# Patient Record
Sex: Male | Born: 1975
Health system: Southern US, Community
[De-identification: ages and names within clinical notes are randomized; demographics above are authoritative.]

## PROBLEM LIST (undated history)

## (undated) ENCOUNTER — Ambulatory Visit: Payer: Self-pay | Source: Home / Self Care

## (undated) DIAGNOSIS — S82899A Other fracture of unspecified lower leg, initial encounter for closed fracture: Secondary | ICD-10-CM

## (undated) DIAGNOSIS — K219 Gastro-esophageal reflux disease without esophagitis: Secondary | ICD-10-CM

---

## 2018-07-23 ENCOUNTER — Emergency Department (HOSPITAL_COMMUNITY)
Admission: EM | Admit: 2018-07-23 | Discharge: 2018-07-23 | Disposition: A | Discharge status: Home / Self Care | Payer: Self-pay | Attending: Emergency Medicine | Admitting: Emergency Medicine

## 2018-07-23 ENCOUNTER — Other Ambulatory Visit: Payer: Self-pay

## 2018-07-23 ENCOUNTER — Encounter (HOSPITAL_COMMUNITY): Payer: Self-pay | Admitting: Emergency Medicine

## 2018-07-23 DIAGNOSIS — F17200 Nicotine dependence, unspecified, uncomplicated: Secondary | ICD-10-CM | POA: Insufficient documentation

## 2018-07-23 DIAGNOSIS — L237 Allergic contact dermatitis due to plants, except food: Secondary | ICD-10-CM | POA: Insufficient documentation

## 2018-07-23 MED ORDER — PREDNISONE 10 MG PO TABS: ORAL_TABLET | ORAL | 0 refills | Status: DC

## 2018-07-23 NOTE — ED Notes (Signed)
Patient verbalizes understanding of discharge instructions. Opportunity for questioning and answers were provided. Armband removed by staff, pt discharged from ED.  

## 2018-07-23 NOTE — Discharge Instructions (Addendum)
Please read attached information. If you experience any new or worsening signs or symptoms please return to the emergency room for evaluation. Please follow-up with your primary care provider or specialist as discussed. Please use medication prescribed only as directed and discontinue taking if you have any concerning signs or symptoms.   °

## 2018-07-23 NOTE — ED Provider Notes (Signed)
[facility] EMERGENCY DEPARTMENT Provider Note   CSN: [id number] Arrival date & time: 07/23/18  1149    History   Chief Complaint Chief Complaint  Patient presents with  . Rash    HPI Juan Marshall is a 43 y.o. male.     HPI   43 year old male presents today with rash.  Patient notes that on July 3 he was out in the yard cleaning the yard.  He notes he was in contact with numerous different plants and bushes.  Patient notes that later on in the day he developed rash to his face forearms and lower legs.  Notes this is extremely itchy.  He believes that he was exposed to poison ivy.  He denies any new abnormal contacts other than the plants, no new medications, he has no significant past medical history this typically is not diabetic.   History reviewed. No pertinent past medical history.  There are no active problems to display for this patient.   History reviewed. No pertinent surgical history.      Home Medications    Prior to Admission medications   Medication Sig Start Date End Date Taking? Authorizing Provider  predniSONE (DELTASONE) 10 MG tablet Days 1-5: Take 4 tablets (40 mg) daily, one each in morning and afternoon, two in the evening Days 6-7: Take 3 tablets (30 mg) daily, morning, afternoon, and evening Days 8-9: Take 2 tablets (20 mg) daily, morning and evening Days 10-11: Take 1 tablet (10 mg) daily Days 12-15: Take 1/2 tablet (5 mg) daily 07/23/18   Okey Regal, PA-C    Family History History reviewed. No pertinent family history.  Social History Social History   Tobacco Use  . Smoking status: Current Every Day Smoker  . Smokeless tobacco: Never Used  Substance Use Topics  . Alcohol use: Yes  . Drug use: Not on file     Allergies   Patient has no known allergies.   Review of Systems Review of Systems  All other systems reviewed and are negative.   Physical Exam Updated Vital Signs BP (!) 130/94 (BP Location: Right Arm)    Pulse 99   Temp 98 F (36.7 C) (Oral)   Resp 18   Ht 6\' 1"  (1.854 m)   Wt 86.2 kg   SpO2 98%   BMI 25.07 kg/m   Physical Exam Vitals signs and nursing note reviewed.  Constitutional:      Appearance: He is well-developed.  HENT:     Head: Normocephalic and atraumatic.  Eyes:     General: No scleral icterus.       Right eye: No discharge.        Left eye: No discharge.     Conjunctiva/sclera: Conjunctivae normal.     Pupils: Pupils are equal, round, and reactive to light.  Neck:     Musculoskeletal: Normal range of motion.     Vascular: No JVD.     Trachea: No tracheal deviation.  Pulmonary:     Effort: Pulmonary effort is normal.     Breath sounds: No stridor.  Skin:    Comments: Erythematous vesicular linear rash to the forehead, forearms and ankles-no surrounding erythema, no intraocular involvement  Neurological:     Mental Status: He is alert and oriented to person, place, and time.     Coordination: Coordination normal.  Psychiatric:        Behavior: Behavior normal.        Thought Content: Thought content normal.  Judgment: Judgment normal.      ED Treatments / Results  Labs (all labs ordered are listed, but only abnormal results are displayed) Labs Reviewed - No data to display  EKG None  Radiology No results found.  Procedures Procedures (including critical care time)  Medications Ordered in ED Medications - No data to display   Initial Impression / Assessment and Plan / ED Course  I have reviewed the triage vital signs and the nursing notes.  Pertinent labs & imaging results that were available during my care of the patient were reviewed by me and considered in my medical decision making (see chart for details).        43 year old male presents today with likely dermatitis secondary to poison ivy/oak.  He has no intraoral or intraocular involvement.  He has extensive burden on his forehead arms and legs.  I do find oral steroids  to be reasonable in this case.  Patient discharged with prednisone, he will follow-up immediately if any new or worsening signs or symptoms present.  Verbalized understanding and agreement to today's plan had no further questions or concerns at the time of discharge.  Final Clinical Impressions(s) / ED Diagnoses   Final diagnoses:  Poison ivy    ED Discharge Orders         Ordered    predniSONE (DELTASONE) 10 MG tablet     07/23/18 1239           Eyvonne MechanicHedges, Venie Montesinos, PA-C 07/25/18 1434    Gerhard MunchLockwood, Robert, MD 07/28/18 [phone]

## 2018-07-23 NOTE — ED Triage Notes (Signed)
Pt arrives to ED from home with complaints of a rash covering his face, both arms, testicles, and both legs. Pt states that it started on 7/3 after doing yard work.

## 2019-06-30 ENCOUNTER — Other Ambulatory Visit: Payer: Self-pay

## 2019-06-30 ENCOUNTER — Emergency Department (HOSPITAL_COMMUNITY): Payer: Self-pay

## 2019-06-30 ENCOUNTER — Encounter (HOSPITAL_COMMUNITY): Payer: Self-pay | Admitting: Emergency Medicine

## 2019-06-30 ENCOUNTER — Emergency Department (HOSPITAL_COMMUNITY)
Admission: EM | Admit: 2019-06-30 | Discharge: 2019-06-30 | Disposition: A | Discharge status: Home / Self Care | Payer: Self-pay | Attending: Emergency Medicine | Admitting: Emergency Medicine

## 2019-06-30 DIAGNOSIS — Y92410 Unspecified street and highway as the place of occurrence of the external cause: Secondary | ICD-10-CM | POA: Insufficient documentation

## 2019-06-30 DIAGNOSIS — S80812A Abrasion, left lower leg, initial encounter: Secondary | ICD-10-CM | POA: Insufficient documentation

## 2019-06-30 DIAGNOSIS — Y9389 Activity, other specified: Secondary | ICD-10-CM | POA: Insufficient documentation

## 2019-06-30 DIAGNOSIS — S89392A Other physeal fracture of lower end of left fibula, initial encounter for closed fracture: Secondary | ICD-10-CM | POA: Insufficient documentation

## 2019-06-30 DIAGNOSIS — Z23 Encounter for immunization: Secondary | ICD-10-CM | POA: Insufficient documentation

## 2019-06-30 DIAGNOSIS — F17200 Nicotine dependence, unspecified, uncomplicated: Secondary | ICD-10-CM | POA: Insufficient documentation

## 2019-06-30 DIAGNOSIS — S89302A Unspecified physeal fracture of lower end of left fibula, initial encounter for closed fracture: Secondary | ICD-10-CM

## 2019-06-30 DIAGNOSIS — Y999 Unspecified external cause status: Secondary | ICD-10-CM | POA: Insufficient documentation

## 2019-06-30 MED ORDER — BACITRACIN ZINC 500 UNIT/GM EX OINT
TOPICAL_OINTMENT | Freq: Two times a day (BID) | CUTANEOUS | Status: DC
  Administered 2019-06-30: 1 via TOPICAL
  Filled 2019-06-30: qty 0.9

## 2019-06-30 MED ORDER — KETOROLAC TROMETHAMINE 60 MG/2ML IM SOLN
60.0000 mg | Freq: Once | INTRAMUSCULAR | Status: AC
  Administered 2019-06-30: 60 mg via INTRAMUSCULAR
  Filled 2019-06-30: qty 2

## 2019-06-30 MED ORDER — HYDROCODONE-ACETAMINOPHEN 5-325 MG PO TABS: 1.0000 | ORAL_TABLET | Freq: Four times a day (QID) | ORAL | 0 refills | Status: DC | PRN

## 2019-06-30 MED ORDER — TETANUS-DIPHTH-ACELL PERTUSSIS 5-2.5-18.5 LF-MCG/0.5 IM SUSP
0.5000 mL | Freq: Once | INTRAMUSCULAR | Status: AC
  Administered 2019-06-30: 0.5 mL via INTRAMUSCULAR
  Filled 2019-06-30: qty 0.5
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
## 2019-06-30 NOTE — Discharge Instructions (Signed)
Please call the office of Dr. Jena Gauss to schedule appointment for ongoing evaluation and management of your fibular fracture.  Please continue to wear your splint and remain nonweightbearing on the affected leg.  Use your crutches.  Elevate your leg at home.  Continue to ice the affected area and take NSAIDs such as Advil or ibuprofen as needed for your symptoms of pain.  I have also prescribed you a short course of Vicodin.  Please only take for breakthrough pain.  Return to the ED or seek immediate medical attention should you experience any new or worsening symptoms.

## 2019-06-30 NOTE — Progress Notes (Signed)
Orthopedic Tech Progress Note Patient Details:  Juan Marshall 01/28/1975 [id number] Spoke with MD and decided to do short leg with stirrups. Ortho Devices Type of Ortho Device: Post (short leg) splint, Stirrup splint Ortho Device/Splint Location: LLE Ortho Device/Splint Interventions: Application, Ordered   Post Interventions Patient Tolerated: Well Instructions Provided: Care of device   Jannett Schmall A Jacques Fife 06/30/2019, 3:52 PM

## 2019-06-30 NOTE — ED Notes (Signed)
Ortho rtech aware of splint needed.
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
## 2019-06-30 NOTE — ED Notes (Signed)
Crutches and long leg splint applied by ortho staff. Pt verbalized understanding of paperwork and ambulated out of the ED.

## 2019-07-09 ENCOUNTER — Ambulatory Visit: Payer: Self-pay | Admitting: Student

## 2019-07-09 DIAGNOSIS — S82892A Other fracture of left lower leg, initial encounter for closed fracture: Secondary | ICD-10-CM | POA: Insufficient documentation

## 2019-07-10 ENCOUNTER — Other Ambulatory Visit (HOSPITAL_COMMUNITY)
Admission: RE | Admit: 2019-07-10 | Discharge: 2019-07-10 | Disposition: A | Discharge status: Home / Self Care | Payer: HRSA Program | Source: Ambulatory Visit | Attending: Student | Admitting: Student

## 2019-07-10 DIAGNOSIS — Z20822 Contact with and (suspected) exposure to covid-19: Secondary | ICD-10-CM | POA: Diagnosis not present

## 2019-07-10 DIAGNOSIS — Z01812 Encounter for preprocedural laboratory examination: Secondary | ICD-10-CM | POA: Diagnosis present

## 2019-07-10 LAB — SARS CORONAVIRUS 2 (TAT 6-24 HRS): SARS Coronavirus 2: NEGATIVE

## 2019-07-11 ENCOUNTER — Encounter (HOSPITAL_COMMUNITY): Payer: Self-pay | Admitting: Student

## 2019-07-11 ENCOUNTER — Other Ambulatory Visit: Payer: Self-pay

## 2019-07-11 NOTE — Anesthesia Preprocedure Evaluation (Addendum)
Anesthesia Evaluation  Patient identified by MRN, date of birth, ID band Patient awake    Reviewed: Allergy & Precautions, NPO status , Patient's Chart, lab work & pertinent test results  Airway Mallampati: II  TM Distance: >3 FB Neck ROM: Full    Dental  (+) Chipped, Missing, Poor Dentition, Dental Advisory Given,    Pulmonary Current Smoker and Patient abstained from smoking.,    Pulmonary exam normal breath sounds clear to auscultation       Cardiovascular negative cardio ROS Normal cardiovascular exam Rhythm:Regular Rate:Normal     Neuro/Psych negative neurological ROS  negative psych ROS   GI/Hepatic Neg liver ROS, GERD  ,  Endo/Other  negative endocrine ROS  Renal/GU negative Renal ROS     Musculoskeletal negative musculoskeletal ROS (+)   Abdominal   Peds  Hematology negative hematology ROS (+)   Anesthesia Other Findings Left ankle fracture  Reproductive/Obstetrics                           Anesthesia Physical Anesthesia Plan  ASA: II  Anesthesia Plan: General and Regional   Post-op Pain Management: GA combined w/ Regional for post-op pain   Induction: Intravenous  PONV Risk Score and Plan: 1 and Ondansetron, Dexamethasone, Midazolam and Treatment may vary due to age or medical condition  Airway Management Planned: Oral ETT  Additional Equipment:   Intra-op Plan:   Post-operative Plan: Extubation in OR  Informed Consent: I have reviewed the patients History and Physical, chart, labs and discussed the procedure including the risks, benefits and alternatives for the proposed anesthesia with the patient or authorized representative who has indicated his/her understanding and acceptance.     Dental advisory given  Plan Discussed with: CRNA and Surgeon  Anesthesia Plan Comments:        Anesthesia Quick Evaluation

## 2019-07-11 NOTE — Progress Notes (Signed)
Pt denies SOB, chest pain, and being under the care of a cardiologist. Pt denies having a PCP. Pt denies having a stress test, echo and cardiac cath. Pt denies having an EKG and chest x ray. Pt denies recent labs. Pt made aware to stop taking Aspirin (unless otherwise advised by surgeon), vitamins, fish oil and herbal medications. Do not take any NSAIDs ie: Ibuprofen, Advil, Naproxen (Aleve), Motrin, BC and Goody Powder. Pt reminded to quarantine. Pt verbalized understanding of all pre-op instructions. 

## 2019-07-12 ENCOUNTER — Ambulatory Visit (HOSPITAL_COMMUNITY): Payer: Self-pay | Admitting: Anesthesiology

## 2019-07-12 ENCOUNTER — Ambulatory Visit (HOSPITAL_COMMUNITY): Payer: Self-pay

## 2019-07-12 ENCOUNTER — Other Ambulatory Visit: Payer: Self-pay

## 2019-07-12 ENCOUNTER — Encounter (HOSPITAL_COMMUNITY): Payer: Self-pay | Admitting: Student

## 2019-07-12 ENCOUNTER — Ambulatory Visit (HOSPITAL_COMMUNITY)
Admission: RE | Admit: 2019-07-12 | Discharge: 2019-07-12 | Disposition: A | Discharge status: Home / Self Care | Payer: Self-pay | Attending: Student | Admitting: Student

## 2019-07-12 ENCOUNTER — Encounter (HOSPITAL_COMMUNITY)
Admission: RE | Disposition: A | Discharge status: Home / Self Care | Payer: Self-pay | Source: Home / Self Care | Attending: Student

## 2019-07-12 DIAGNOSIS — T148XXA Other injury of unspecified body region, initial encounter: Secondary | ICD-10-CM

## 2019-07-12 DIAGNOSIS — Z419 Encounter for procedure for purposes other than remedying health state, unspecified: Secondary | ICD-10-CM

## 2019-07-12 DIAGNOSIS — S82832A Other fracture of upper and lower end of left fibula, initial encounter for closed fracture: Secondary | ICD-10-CM | POA: Insufficient documentation

## 2019-07-12 DIAGNOSIS — K219 Gastro-esophageal reflux disease without esophagitis: Secondary | ICD-10-CM | POA: Insufficient documentation

## 2019-07-12 DIAGNOSIS — S82892A Other fracture of left lower leg, initial encounter for closed fracture: Secondary | ICD-10-CM | POA: Insufficient documentation

## 2019-07-12 DIAGNOSIS — F17210 Nicotine dependence, cigarettes, uncomplicated: Secondary | ICD-10-CM | POA: Insufficient documentation

## 2019-07-12 HISTORY — DX: Gastro-esophageal reflux disease without esophagitis: K21.9

## 2019-07-12 HISTORY — DX: Other fracture of unspecified lower leg, initial encounter for closed fracture: S82.899A

## 2019-07-12 HISTORY — PX: ORIF ANKLE FRACTURE: SHX5408

## 2019-07-12 LAB — CBC
HCT: 46 % (ref 39.0–52.0)
Hemoglobin: 14.9 g/dL (ref 13.0–17.0)
MCH: 28.7 pg (ref 26.0–34.0)
MCHC: 32.4 g/dL (ref 30.0–36.0)
MCV: 88.6 fL (ref 80.0–100.0)
Platelets: 297 10*3/uL (ref 150–400)
RBC: 5.19 MIL/uL (ref 4.22–5.81)
RDW: 13.7 % (ref 11.5–15.5)
WBC: 7.1 10*3/uL (ref 4.0–10.5)
nRBC: 0 % (ref 0.0–0.2)

## 2019-07-12 LAB — SURGICAL PCR SCREEN
MRSA, PCR: NEGATIVE
Staphylococcus aureus: NEGATIVE

## 2019-07-12 SURGERY — OPEN REDUCTION INTERNAL FIXATION (ORIF) ANKLE FRACTURE
Anesthesia: Regional | Site: Ankle | Laterality: Left

## 2019-07-12 MED ORDER — ONDANSETRON HCL 4 MG/2ML IJ SOLN
INTRAMUSCULAR | Status: DC | PRN
  Administered 2019-07-12: 4 mg via INTRAVENOUS

## 2019-07-12 MED ORDER — OXYCODONE HCL 5 MG PO TABS
ORAL_TABLET | ORAL | Status: AC
  Filled 2019-07-12: qty 1

## 2019-07-12 MED ORDER — LIDOCAINE 2% (20 MG/ML) 5 ML SYRINGE
INTRAMUSCULAR | Status: AC
  Filled 2019-07-12: qty 5

## 2019-07-12 MED ORDER — PROPOFOL 1000 MG/100ML IV EMUL
INTRAVENOUS | Status: AC
  Filled 2019-07-12: qty 100

## 2019-07-12 MED ORDER — PROPOFOL 10 MG/ML IV BOLUS
INTRAVENOUS | Status: DC | PRN
  Administered 2019-07-12: 150 mg via INTRAVENOUS
  Administered 2019-07-12: 50 mg via INTRAVENOUS
  Administered 2019-07-12: 100 mg via INTRAVENOUS

## 2019-07-12 MED ORDER — ACETAMINOPHEN 500 MG PO TABS
1000.0000 mg | ORAL_TABLET | Freq: Once | ORAL | Status: AC
  Administered 2019-07-12: 1000 mg via ORAL
  Filled 2019-07-12: qty 2

## 2019-07-12 MED ORDER — ROPIVACAINE HCL 5 MG/ML IJ SOLN
INTRAMUSCULAR | Status: DC | PRN
  Administered 2019-07-12: 30 mL via EPIDURAL
  Administered 2019-07-12: 20 mL via EPIDURAL

## 2019-07-12 MED ORDER — VANCOMYCIN HCL 1000 MG IV SOLR
INTRAVENOUS | Status: DC | PRN
  Administered 2019-07-12: 1000 mg

## 2019-07-12 MED ORDER — CEFAZOLIN SODIUM-DEXTROSE 2-4 GM/100ML-% IV SOLN
2.0000 g | INTRAVENOUS | Status: AC
  Administered 2019-07-12: 2 g via INTRAVENOUS
  Filled 2019-07-12: qty 100

## 2019-07-12 MED ORDER — MIDAZOLAM HCL 2 MG/2ML IJ SOLN
INTRAMUSCULAR | Status: AC
  Filled 2019-07-12: qty 2

## 2019-07-12 MED ORDER — ASPIRIN EC 325 MG PO TBEC
325.0000 mg | DELAYED_RELEASE_TABLET | Freq: Every day | ORAL | 0 refills | Status: AC
Start: 2019-07-12 — End: 2019-08-23

## 2019-07-12 MED ORDER — FENTANYL CITRATE (PF) 250 MCG/5ML IJ SOLN
INTRAMUSCULAR | Status: DC | PRN
  Administered 2019-07-12 (×4): 50 ug via INTRAVENOUS

## 2019-07-12 MED ORDER — MIDAZOLAM HCL 5 MG/5ML IJ SOLN
INTRAMUSCULAR | Status: DC | PRN
  Administered 2019-07-12: 2 mg via INTRAVENOUS

## 2019-07-12 MED ORDER — POVIDONE-IODINE 10 % EX SWAB: 2.0000 "application " | Freq: Once | CUTANEOUS | Status: DC

## 2019-07-12 MED ORDER — OXYCODONE HCL 5 MG/5ML PO SOLN: 5.0000 mg | Freq: Once | ORAL | Status: AC | PRN

## 2019-07-12 MED ORDER — 0.9 % SODIUM CHLORIDE (POUR BTL) OPTIME
TOPICAL | Status: DC | PRN
  Administered 2019-07-12: 1000 mL

## 2019-07-12 MED ORDER — CHLORHEXIDINE GLUCONATE 4 % EX LIQD: 60.0000 mL | Freq: Once | CUTANEOUS | Status: DC

## 2019-07-12 MED ORDER — CLONIDINE HCL (ANALGESIA) 100 MCG/ML EP SOLN
EPIDURAL | Status: DC | PRN
Start: 2019-07-12 — End: 2019-07-12
  Administered 2019-07-12: 100 ug

## 2019-07-12 MED ORDER — PROPOFOL 10 MG/ML IV BOLUS
INTRAVENOUS | Status: AC
  Filled 2019-07-12: qty 20

## 2019-07-12 MED ORDER — FENTANYL CITRATE (PF) 250 MCG/5ML IJ SOLN
INTRAMUSCULAR | Status: AC
  Filled 2019-07-12: qty 5

## 2019-07-12 MED ORDER — LIDOCAINE 2% (20 MG/ML) 5 ML SYRINGE
INTRAMUSCULAR | Status: DC | PRN
  Administered 2019-07-12: 40 mg via INTRAVENOUS

## 2019-07-12 MED ORDER — OXYCODONE HCL 5 MG PO TABS
5.0000 mg | ORAL_TABLET | Freq: Once | ORAL | Status: AC | PRN
  Administered 2019-07-12: 5 mg via ORAL

## 2019-07-12 MED ORDER — BUPIVACAINE HCL (PF) 0.5 % IJ SOLN
INTRAMUSCULAR | Status: AC
  Filled 2019-07-12: qty 30

## 2019-07-12 MED ORDER — METHOCARBAMOL 500 MG PO TABS: 500.0000 mg | ORAL_TABLET | Freq: Four times a day (QID) | ORAL | 1 refills | Status: DC | PRN

## 2019-07-12 MED ORDER — ONDANSETRON HCL 4 MG/2ML IJ SOLN
INTRAMUSCULAR | Status: AC
  Filled 2019-07-12: qty 2

## 2019-07-12 MED ORDER — CHLORHEXIDINE GLUCONATE 0.12 % MT SOLN
15.0000 mL | Freq: Once | OROMUCOSAL | Status: AC
  Administered 2019-07-12: 15 mL via OROMUCOSAL
  Filled 2019-07-12: qty 15

## 2019-07-12 MED ORDER — SUGAMMADEX SODIUM 200 MG/2ML IV SOLN
INTRAVENOUS | Status: DC | PRN
  Administered 2019-07-12: 200 mg via INTRAVENOUS

## 2019-07-12 MED ORDER — LACTATED RINGERS IV SOLN: INTRAVENOUS | Status: DC | PRN

## 2019-07-12 MED ORDER — ORAL CARE MOUTH RINSE: 15.0000 mL | Freq: Once | OROMUCOSAL | Status: AC

## 2019-07-12 MED ORDER — LACTATED RINGERS IV SOLN: INTRAVENOUS | Status: DC

## 2019-07-12 MED ORDER — HYDROMORPHONE HCL 1 MG/ML IJ SOLN: 0.2500 mg | INTRAMUSCULAR | Status: DC | PRN

## 2019-07-12 MED ORDER — KETOROLAC TROMETHAMINE 30 MG/ML IJ SOLN
INTRAMUSCULAR | Status: AC
  Filled 2019-07-12: qty 1

## 2019-07-12 MED ORDER — ROCURONIUM BROMIDE 10 MG/ML (PF) SYRINGE
PREFILLED_SYRINGE | INTRAVENOUS | Status: DC | PRN
  Administered 2019-07-12: 60 mg via INTRAVENOUS

## 2019-07-12 MED ORDER — ROCURONIUM BROMIDE 10 MG/ML (PF) SYRINGE
PREFILLED_SYRINGE | INTRAVENOUS | Status: AC
  Filled 2019-07-12: qty 10

## 2019-07-12 MED ORDER — VANCOMYCIN HCL 1000 MG IV SOLR
INTRAVENOUS | Status: AC
  Filled 2019-07-12: qty 1000

## 2019-07-12 MED ORDER — KETOROLAC TROMETHAMINE 30 MG/ML IJ SOLN
30.0000 mg | Freq: Once | INTRAMUSCULAR | Status: AC | PRN
  Administered 2019-07-12: 30 mg via INTRAVENOUS

## 2019-07-12 MED ORDER — DEXAMETHASONE SODIUM PHOSPHATE 10 MG/ML IJ SOLN
INTRAMUSCULAR | Status: DC | PRN
  Administered 2019-07-12: 10 mg via INTRAVENOUS

## 2019-07-12 MED ORDER — PROMETHAZINE HCL 25 MG/ML IJ SOLN: 6.2500 mg | INTRAMUSCULAR | Status: DC | PRN

## 2019-07-12 SURGICAL SUPPLY — 71 items
BANDAGE ESMARK 6X9 LF (GAUZE/BANDAGES/DRESSINGS) ×1 IMPLANT
BIT DRILL QC 2.0 SHORT EVOS SM (DRILL) ×1 IMPLANT
BIT DRILL QC 2.5MM SHRT EVO SM (DRILL) ×1 IMPLANT
BNDG COHESIVE 4X5 TAN STRL (GAUZE/BANDAGES/DRESSINGS) ×3 IMPLANT
BNDG ELASTIC 4X5.8 VLCR STR LF (GAUZE/BANDAGES/DRESSINGS) ×3 IMPLANT
BNDG ELASTIC 6X5.8 VLCR STR LF (GAUZE/BANDAGES/DRESSINGS) ×3 IMPLANT
BNDG ESMARK 6X9 LF (GAUZE/BANDAGES/DRESSINGS) ×3
BRUSH SCRUB EZ PLAIN DRY (MISCELLANEOUS) ×6 IMPLANT
CHLORAPREP W/TINT 26 (MISCELLANEOUS) ×3 IMPLANT
COVER SURGICAL LIGHT HANDLE (MISCELLANEOUS) ×3 IMPLANT
COVER WAND RF STERILE (DRAPES) ×3 IMPLANT
DRAPE C-ARM 42X72 X-RAY (DRAPES) ×3 IMPLANT
DRAPE C-ARMOR (DRAPES) ×3 IMPLANT
DRAPE ORTHO SPLIT 77X108 STRL (DRAPES) ×4
DRAPE SURG ORHT 6 SPLT 77X108 (DRAPES) ×2 IMPLANT
DRAPE U-SHAPE 47X51 STRL (DRAPES) ×3 IMPLANT
DRILL QC 2.0 SHORT EVOS SM (DRILL) ×3
DRILL QC 2.5MM SHORT EVOS SM (DRILL) ×3
DRSG ADAPTIC 3X8 NADH LF (GAUZE/BANDAGES/DRESSINGS) IMPLANT
DRSG MEPITEL 4X7.2 (GAUZE/BANDAGES/DRESSINGS) ×3 IMPLANT
ELECT REM PT RETURN 9FT ADLT (ELECTROSURGICAL) ×3
ELECTRODE REM PT RTRN 9FT ADLT (ELECTROSURGICAL) ×1 IMPLANT
GAUZE SPONGE 4X4 12PLY STRL (GAUZE/BANDAGES/DRESSINGS) IMPLANT
GAUZE SPONGE 4X4 12PLY STRL LF (GAUZE/BANDAGES/DRESSINGS) ×3 IMPLANT
GLOVE BIO SURGEON STRL SZ 6.5 (GLOVE) ×6 IMPLANT
GLOVE BIO SURGEON STRL SZ7.5 (GLOVE) ×9 IMPLANT
GLOVE BIO SURGEONS STRL SZ 6.5 (GLOVE) ×3
GLOVE BIOGEL PI IND STRL 6.5 (GLOVE) ×1 IMPLANT
GLOVE BIOGEL PI IND STRL 7.5 (GLOVE) ×1 IMPLANT
GLOVE BIOGEL PI INDICATOR 6.5 (GLOVE) ×2
GLOVE BIOGEL PI INDICATOR 7.5 (GLOVE) ×2
GOWN STRL REUS W/ TWL LRG LVL3 (GOWN DISPOSABLE) ×2 IMPLANT
GOWN STRL REUS W/TWL LRG LVL3 (GOWN DISPOSABLE) ×4
K-WIRE 1.6 (WIRE) ×4
K-WIRE FX150X1.6XTROC PNT (WIRE) ×2
KIT TURNOVER KIT B (KITS) ×3 IMPLANT
KWIRE FX150X1.6XTROC PNT (WIRE) ×2 IMPLANT
MANIFOLD NEPTUNE II (INSTRUMENTS) ×3 IMPLANT
NEEDLE HYPO 21X1.5 SAFETY (NEEDLE) IMPLANT
NS IRRIG 1000ML POUR BTL (IV SOLUTION) ×3 IMPLANT
PACK TOTAL JOINT (CUSTOM PROCEDURE TRAY) ×3 IMPLANT
PAD ARMBOARD 7.5X6 YLW CONV (MISCELLANEOUS) ×6 IMPLANT
PAD CAST 4YDX4 CTTN HI CHSV (CAST SUPPLIES) ×1 IMPLANT
PADDING CAST COTTON 4X4 STRL (CAST SUPPLIES) ×2
PADDING CAST COTTON 6X4 STRL (CAST SUPPLIES) ×3 IMPLANT
PLATE POST FIB 7H 27 3.5 L (Plate) ×3 IMPLANT
SCREW CORT 3.5X14 ST EVOS (Screw) ×6 IMPLANT
SCREW CORT EVOS ST 3.5X12 (Screw) ×3 IMPLANT
SCREW CORT ST EVOS 3.5X46 (Screw) ×3 IMPLANT
SCREW CTX 3.5X50MM EVOS (Screw) ×3 IMPLANT
SCREW EVOS 2.7X18 LOCK T8 (Screw) ×3 IMPLANT
SCREW LOCK ST EVOS 2.7X20 (Screw) ×6 IMPLANT
SCREW LOCK ST EVOS 2.7X22 (Screw) ×6 IMPLANT
SPLINT PLASTER CAST XFAST 5X30 (CAST SUPPLIES) ×1 IMPLANT
SPLINT PLASTER XFAST SET 5X30 (CAST SUPPLIES) ×2
SPONGE LAP 18X18 RF (DISPOSABLE) IMPLANT
STAPLER VISISTAT 35W (STAPLE) ×3 IMPLANT
SUCTION FRAZIER HANDLE 10FR (MISCELLANEOUS) ×2
SUCTION TUBE FRAZIER 10FR DISP (MISCELLANEOUS) ×1 IMPLANT
SUT ETHILON 3 0 FSL (SUTURE) ×3 IMPLANT
SUT ETHILON 3 0 PS 1 (SUTURE) ×6 IMPLANT
SUT PROLENE 0 CT (SUTURE) IMPLANT
SUT VIC AB 0 CT1 27 (SUTURE) ×2
SUT VIC AB 0 CT1 27XBRD ANBCTR (SUTURE) ×1 IMPLANT
SUT VIC AB 2-0 CT1 27 (SUTURE) ×4
SUT VIC AB 2-0 CT1 TAPERPNT 27 (SUTURE) ×2 IMPLANT
SYR CONTROL 10ML LL (SYRINGE) ×3 IMPLANT
TOWEL GREEN STERILE (TOWEL DISPOSABLE) ×6 IMPLANT
TOWEL GREEN STERILE FF (TOWEL DISPOSABLE) ×3 IMPLANT
UNDERPAD 30X36 HEAVY ABSORB (UNDERPADS AND DIAPERS) ×3 IMPLANT
WATER STERILE IRR 1000ML POUR (IV SOLUTION) ×3 IMPLANT

## 2019-07-12 NOTE — Transfer of Care (Signed)
Immediate Anesthesia Transfer of Care Note  Patient: Juan Marshall  Procedure(s) Performed: OPEN REDUCTION INTERNAL FIXATION (ORIF) ANKLE FRACTURE (Left Ankle)  Patient Location: PACU  Anesthesia Type:General and Regional  Level of Consciousness: awake, alert  and oriented  Airway & Oxygen Therapy: Patient Spontanous Breathing  Post-op Assessment: Report given to RN, Post -op Vital signs reviewed and stable and Patient moving all extremities X 4  Post vital signs: Reviewed and stable  Last Vitals:  Vitals Value Taken Time  BP 136/87 07/12/19 0942  Temp    Pulse 73 07/12/19 0943  Resp 19 07/12/19 0943  SpO2 96 % 07/12/19 0943  Vitals shown include unvalidated device data.  Last Pain:  Vitals:   07/12/19 0940  TempSrc:   PainSc: (P) 0-No pain      Patients Stated Pain Goal: 3 (07/12/19 0610)  Complications: No complications documented.

## 2019-07-12 NOTE — Anesthesia Procedure Notes (Signed)
Procedure Name: Intubation Date/Time: 07/12/2019 7:54 AM Performed by: Nils Pyle, CRNA Pre-anesthesia Checklist: Patient identified, Emergency Drugs available, Suction available and Patient being monitored Patient Re-evaluated:Patient Re-evaluated prior to induction Oxygen Delivery Method: Circle System Utilized Preoxygenation: Pre-oxygenation with 100% oxygen Induction Type: IV induction Ventilation: Mask ventilation without difficulty and Oral airway inserted - appropriate to patient size Laryngoscope Size: Hyacinth Meeker and 2 Grade View: Grade I Tube type: Oral Tube size: 7.5 mm Number of attempts: 1 Airway Equipment and Method: Stylet and Oral airway Placement Confirmation: ETT inserted through vocal cords under direct vision,  positive ETCO2 and breath sounds checked- equal and bilateral Secured at: 23 cm Tube secured with: Tape Dental Injury: Teeth and Oropharynx as per pre-operative assessment

## 2019-07-12 NOTE — Progress Notes (Signed)
Orthopedic Tech Progress Note Patient Details:  Juan Marshall 1975-10-05 [id number] OR RN called requesting a CAM WALKER BOOT. Dropped off at desk Ortho Devices Type of Ortho Device: CAM walker Ortho Device/Splint Interventions: Other (comment)   Post Interventions Patient Tolerated: Other (comment) Instructions Provided: Other (comment)   Donald Pore 07/12/2019, 8:58 AM
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
## 2019-07-12 NOTE — Anesthesia Procedure Notes (Signed)
Anesthesia Regional Block: Popliteal block   Pre-Anesthetic Checklist: ,, timeout performed, Correct Patient, Correct Site, Correct Laterality, Correct Procedure, Correct Position, site marked, Risks and benefits discussed,  Surgical consent,  Pre-op evaluation,  At surgeon's request and post-op pain management  Laterality: Left  Prep: chloraprep       Needles:  Injection technique: Single-shot  Needle Type: Echogenic Stimulator Needle     Needle Length: 10cm  Needle Gauge: 20     Additional Needles:   Procedures:,,,, ultrasound used (permanent image in chart),,,,  Narrative:  Start time: 07/12/2019 7:10 AM End time: 07/12/2019 7:20 AM Injection made incrementally with aspirations every 5 mL.  Performed by: Personally  Anesthesiologist: Leonides Grills, MD  Additional Notes: Functioning IV was confirmed and monitors were applied.  A timeout was performed. Sterile prep, hand hygiene and sterile gloves were used. A 20ga BBraun echogenic stimulator needle was used. Negative aspiration and negative test dose prior to incremental administration of local anesthetic. The patient tolerated the procedure well.  Ultrasound guidance: relevent anatomy identified, needle position confirmed, local anesthetic spread visualized around nerve(s), vascular puncture avoided.  Image printed for medical record.
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
## 2019-07-12 NOTE — Anesthesia Procedure Notes (Signed)
Anesthesia Regional Block: Adductor canal block   Pre-Anesthetic Checklist: ,, timeout performed, Correct Patient, Correct Site, Correct Laterality, Correct Procedure,, site marked, risks and benefits discussed, Surgical consent,  Pre-op evaluation,  At surgeon's request and post-op pain management  Laterality: Left  Prep: chloraprep       Needles:  Injection technique: Single-shot  Needle Type: Echogenic Stimulator Needle     Needle Length: 10cm  Needle Gauge: 20     Additional Needles:   Procedures:,,,, ultrasound used (permanent image in chart),,,,  Narrative:  Start time: 07/12/2019 7:20 AM End time: 07/12/2019 7:30 AM Injection made incrementally with aspirations every 5 mL.  Performed by: Personally  Anesthesiologist: Leonides Grills, MD  Additional Notes: Functioning IV was confirmed and monitors were applied. A time-out was performed. Hand hygiene and sterile gloves were used. The thigh was placed in a frog-leg position and prepped in a sterile fashion. A 20ga BBraun echogenic stimulator needle was placed using ultrasound guidance.  Negative aspiration and negative test dose prior to incremental administration of local anesthetic. The patient tolerated the procedure well.

## 2019-07-12 NOTE — Anesthesia Postprocedure Evaluation (Signed)
Anesthesia Post Note  Patient: Hydrologist  Procedure(s) Performed: OPEN REDUCTION INTERNAL FIXATION (ORIF) ANKLE FRACTURE (Left Ankle)     Patient location during evaluation: PACU Anesthesia Type: Regional and General Level of consciousness: awake and alert Pain management: pain level controlled Vital Signs Assessment: post-procedure vital signs reviewed and stable Respiratory status: spontaneous breathing, nonlabored ventilation, respiratory function stable and patient connected to nasal cannula oxygen Cardiovascular status: blood pressure returned to baseline and stable Postop Assessment: no apparent nausea or vomiting Anesthetic complications: no   No complications documented.  Last Vitals:  Vitals:   07/12/19 1000 07/12/19 1015  BP: 131/86 124/90  Pulse: 67 72  Resp: 16 16  Temp:  36.4 C  SpO2: 99% 100%    Last Pain:  Vitals:   07/12/19 1000  TempSrc:   PainSc: 7                  Keiera Strathman P Asusena Sigley

## 2019-07-16 ENCOUNTER — Encounter (HOSPITAL_COMMUNITY): Payer: Self-pay | Admitting: Student

## 2019-08-28 ENCOUNTER — Ambulatory Visit: Payer: Self-pay | Attending: Student | Admitting: Physical Therapy

## 2020-06-29 ENCOUNTER — Other Ambulatory Visit: Payer: Self-pay

## 2020-06-29 ENCOUNTER — Emergency Department (HOSPITAL_COMMUNITY)
Admission: EM | Admit: 2020-06-29 | Discharge: 2020-06-29 | Disposition: A | Discharge status: Home / Self Care | Payer: Self-pay | Attending: Student | Admitting: Student

## 2020-06-29 DIAGNOSIS — Z28310 Unvaccinated for covid-19: Secondary | ICD-10-CM | POA: Insufficient documentation

## 2020-06-29 DIAGNOSIS — U071 COVID-19: Secondary | ICD-10-CM | POA: Insufficient documentation

## 2020-06-29 DIAGNOSIS — F17210 Nicotine dependence, cigarettes, uncomplicated: Secondary | ICD-10-CM | POA: Insufficient documentation

## 2020-06-29 DIAGNOSIS — J069 Acute upper respiratory infection, unspecified: Secondary | ICD-10-CM | POA: Insufficient documentation

## 2020-06-29 LAB — RESP PANEL BY RT-PCR (FLU A&B, COVID) ARPGX2
Influenza A by PCR: NEGATIVE
Influenza B by PCR: NEGATIVE
SARS Coronavirus 2 by RT PCR: POSITIVE — AB
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
## 2020-06-29 NOTE — Discharge Instructions (Addendum)
Sickle exam, vital signs, andYou are seen in the ER today for your cough, shortness of breath, and eye irritation as well as your fevers and chills.  Your physical exam and vital signs are very reassuring.  You have been tested for COVID-19 and influenza A/B; will follow these test results in your MyChart app.  Return to the ER if you develop any worsening chest pain, shortness of breath, palpitations, nausea or vomiting does not stop or any other new severe symptoms.

## 2021-06-26 IMAGING — DX DG ANKLE COMPLETE 3+V*L*
1 series · 4 of 4 positions shown · non-contrast
Comparison: None.

CLINICAL DATA: Left ankle injury

EXAM:
LEFT ANKLE COMPLETE - 3+ VIEW

[Series 1: ankle · 0.14mm/px · 4 of 4 slices shown]
[im 1/4]
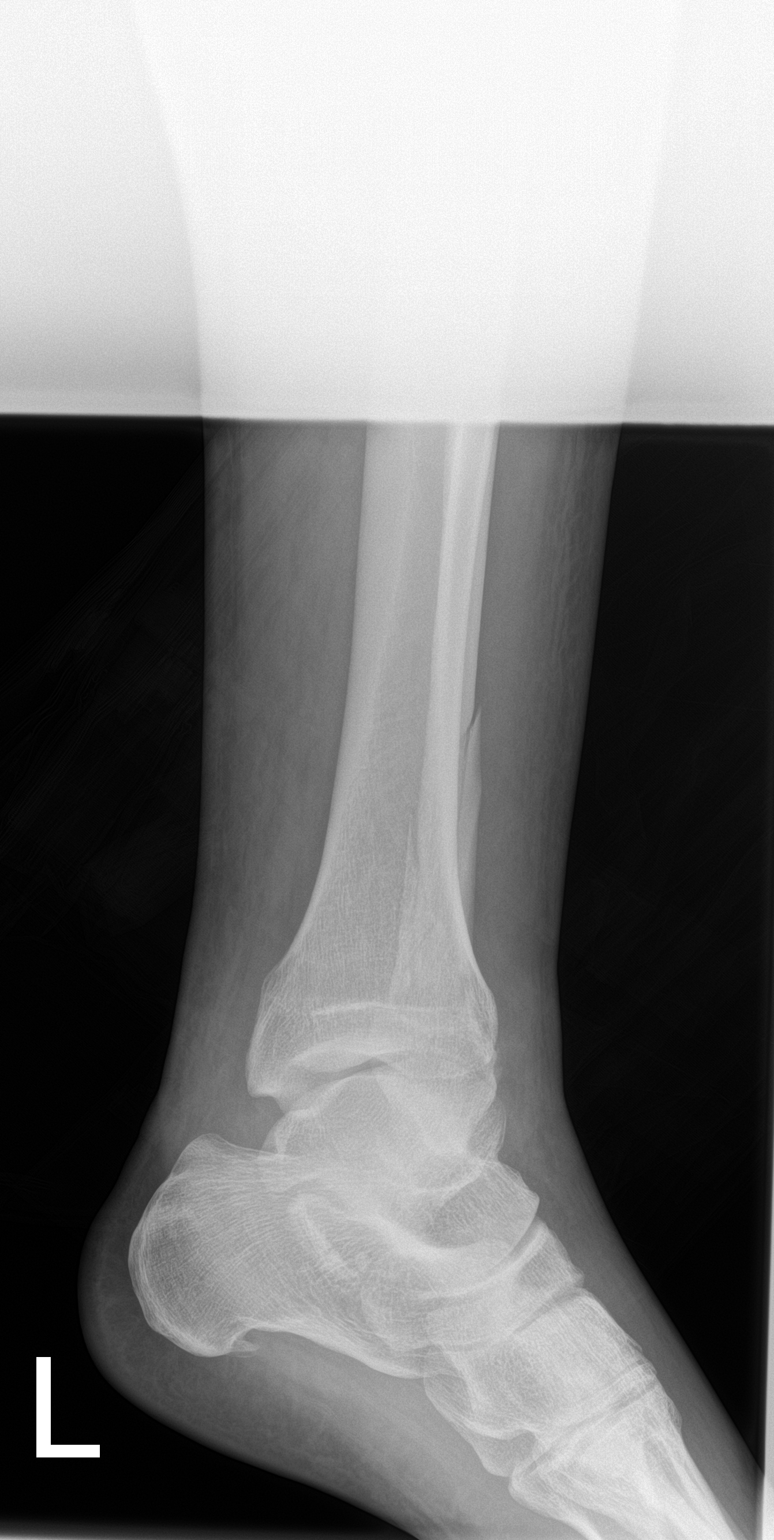
[im 2/4]
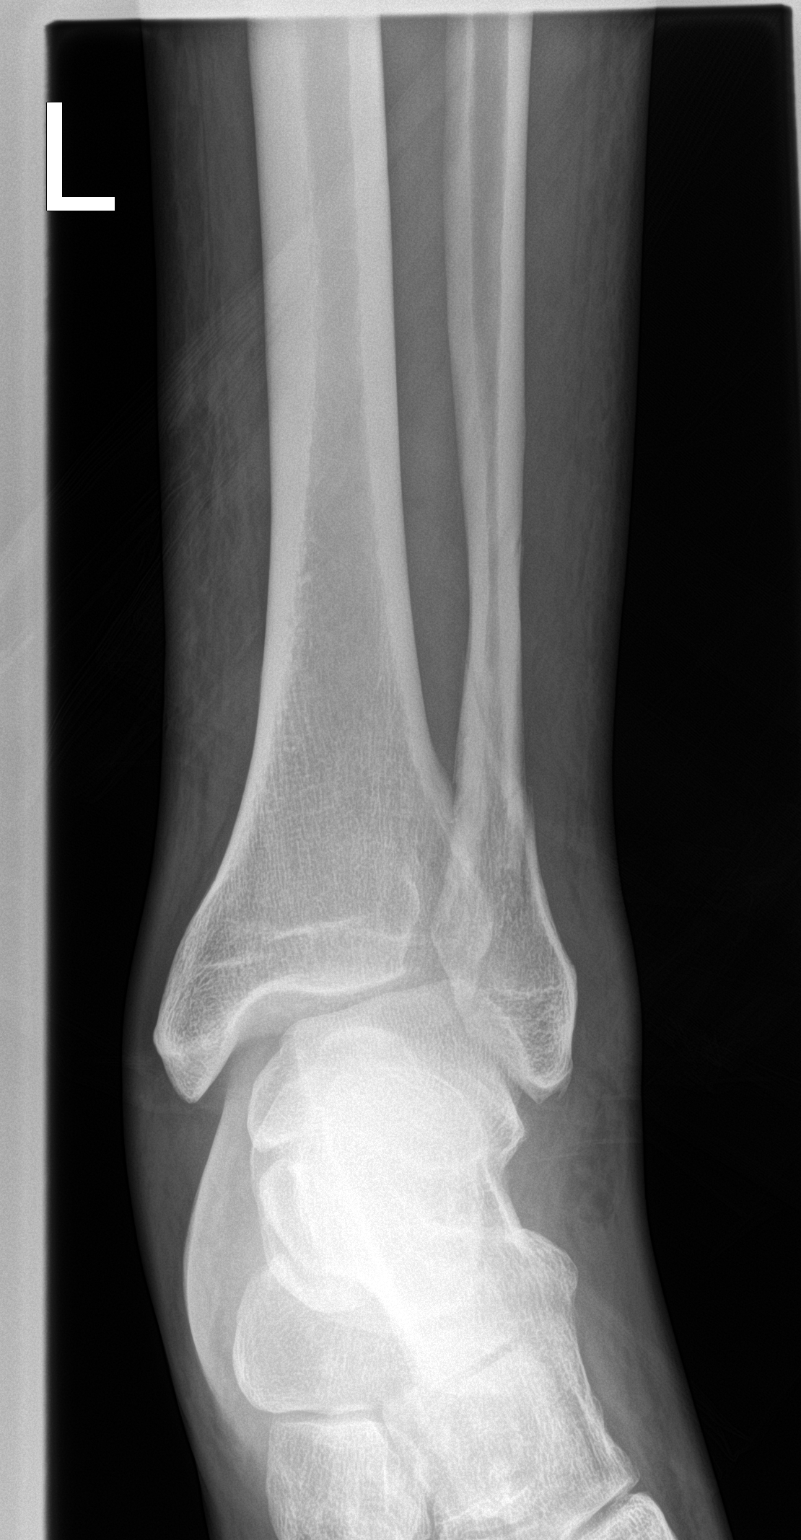
[im 3/4]
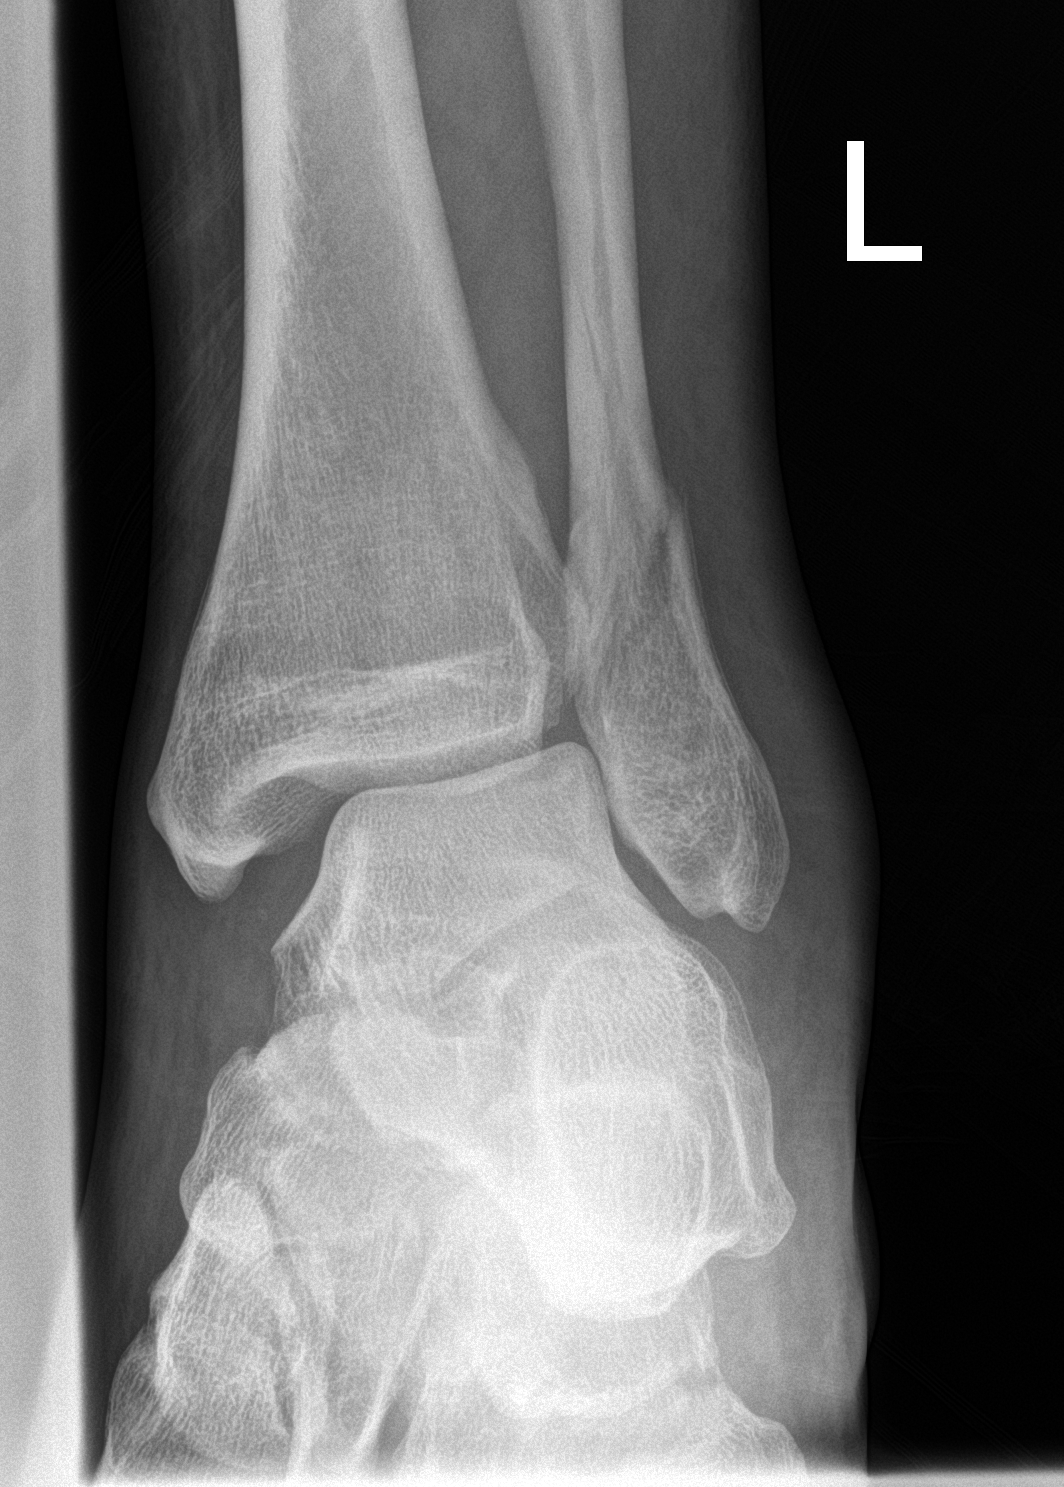
[im 4/4]
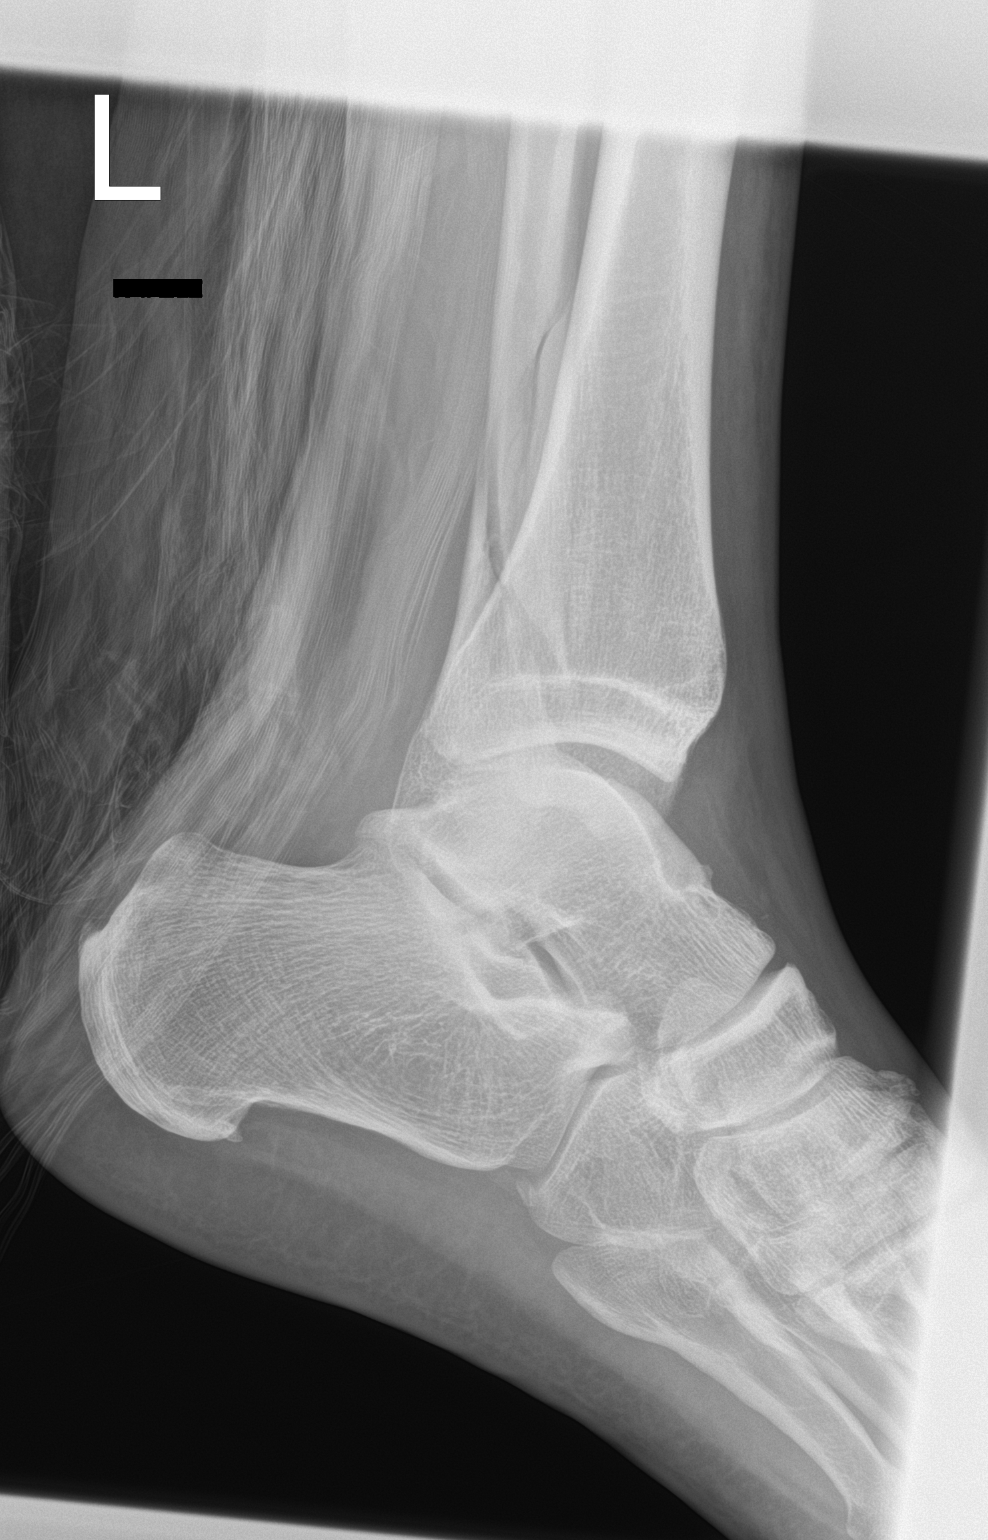

[4 of 4 positions shown; findings below may reference images not displayed]

FINDINGS: Minimally displaced, spiral type fracture of the distal left fibular
metadiaphysis, with widening of the tibiofibular interval and ankle
mortise. The distal tibia is intact. Soft tissue edema about the
ankle.
IMPRESSION: Minimally displaced, spiral type fracture of the distal left fibular
metadiaphysis, with widening of the tibiofibular interval and ankle
mortise. The distal tibia is intact.

## 2021-07-08 IMAGING — RF DG ANKLE COMPLETE 3+V*L*
1 series · 9 of 9 positions shown · non-contrast
Comparison: June 30, 2019.

CLINICAL DATA: Abduction internal fixation of left ankle fracture.

EXAM:
LEFT ANKLE COMPLETE - 3+ VIEW; DG C-ARM 1-60 MIN
Radiation exposure index: 0.7 mGy.

[Series 1: run · 9 of 9 slices shown]
[im 1/9]
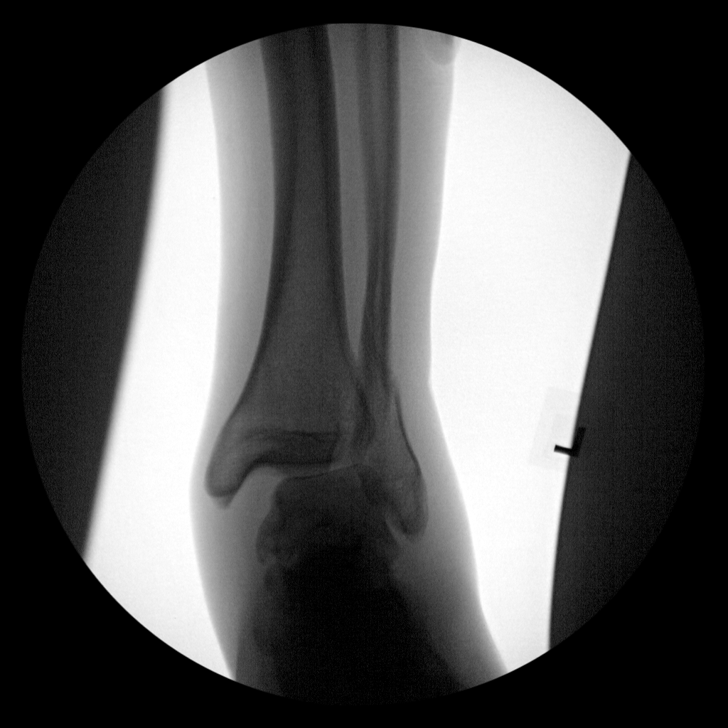
[im 2/9]
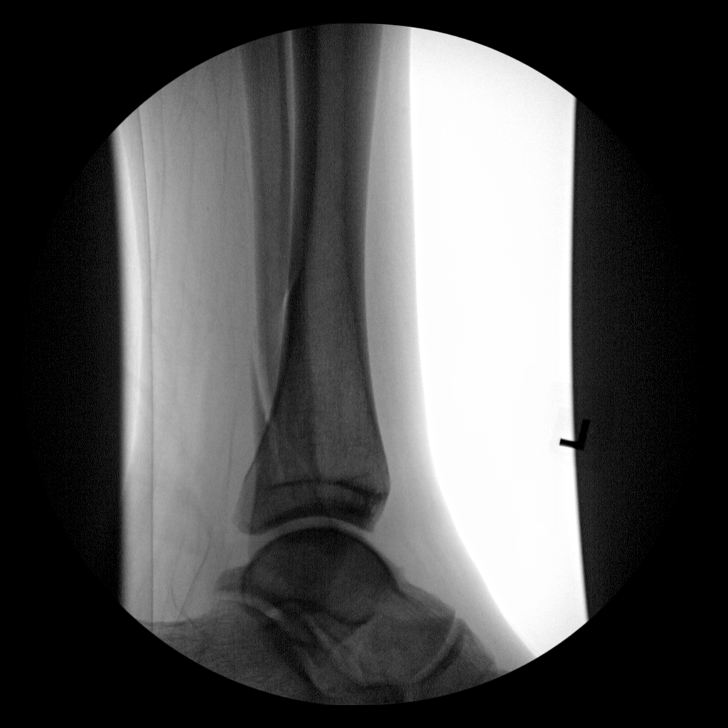
[im 3/9]
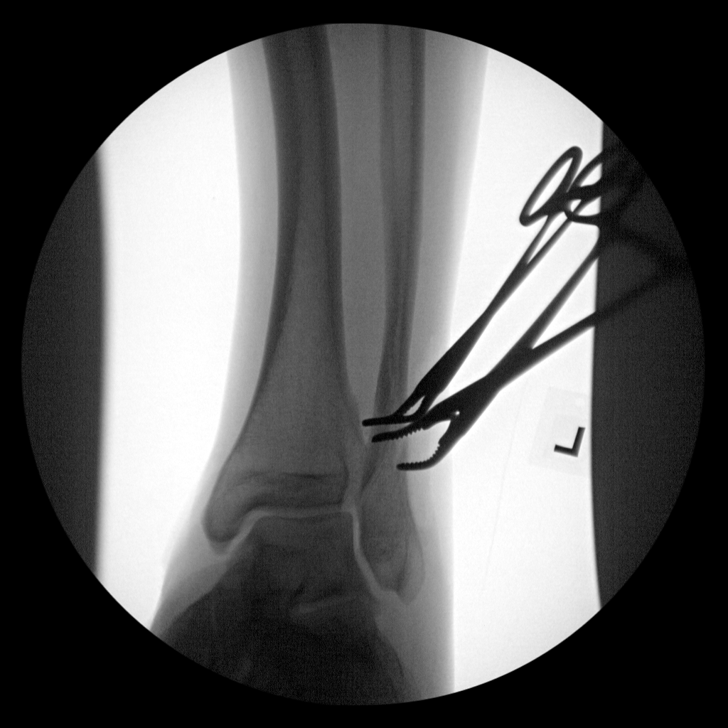
[im 4/9]
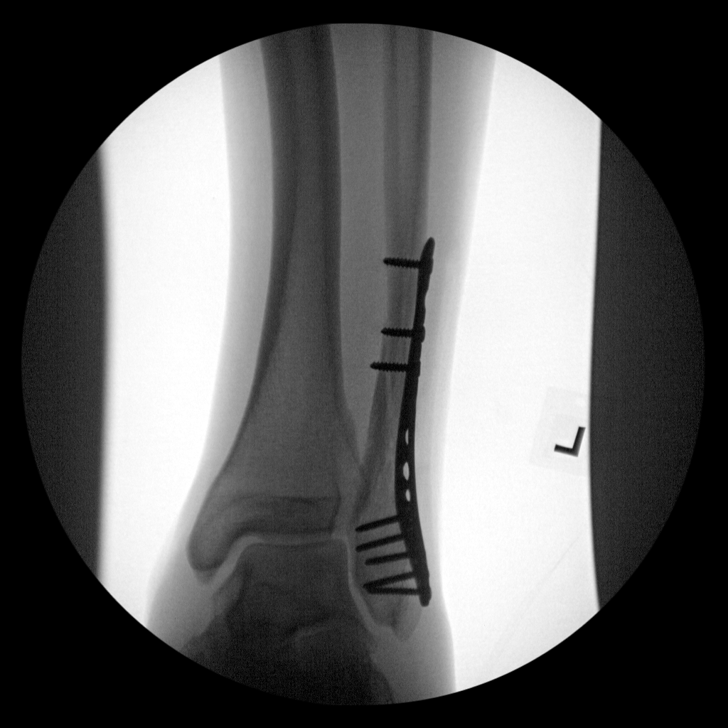
[im 5/9]
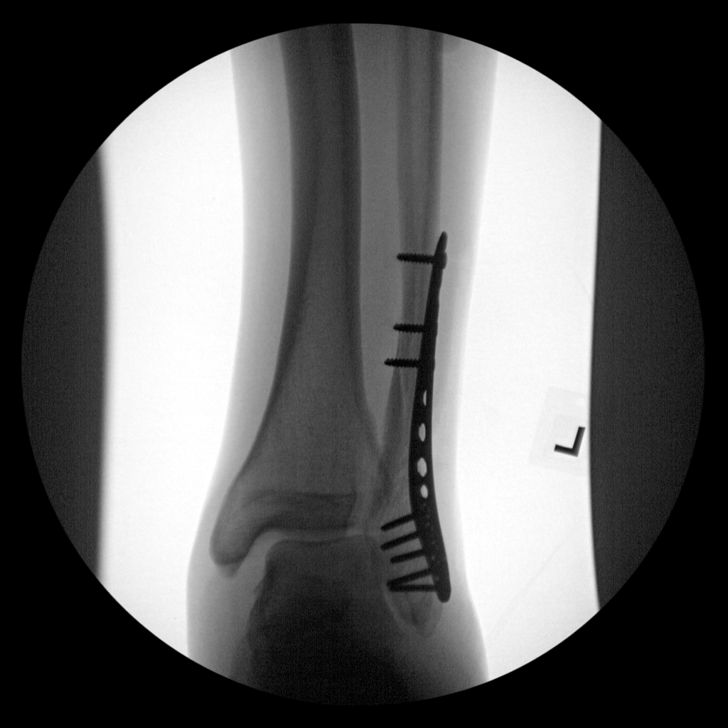
[im 6/9]
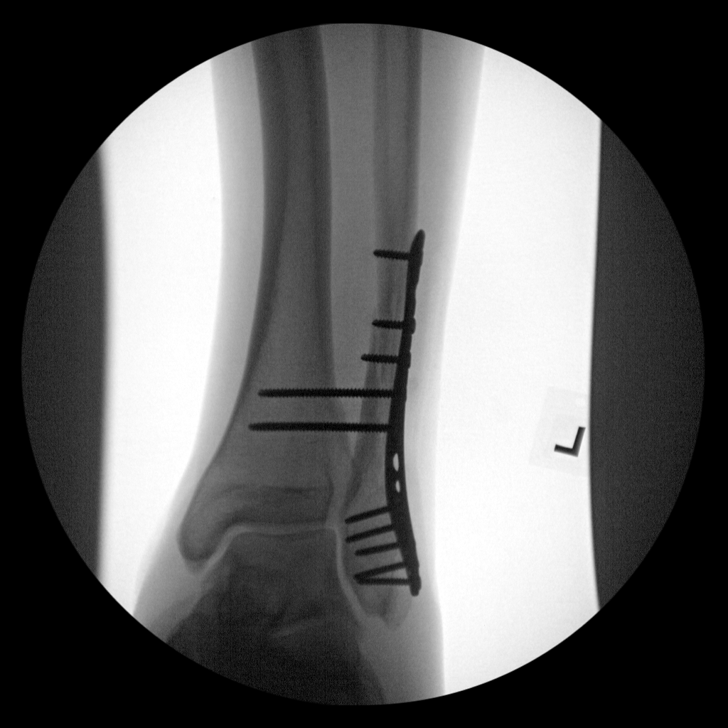
[im 7/9]
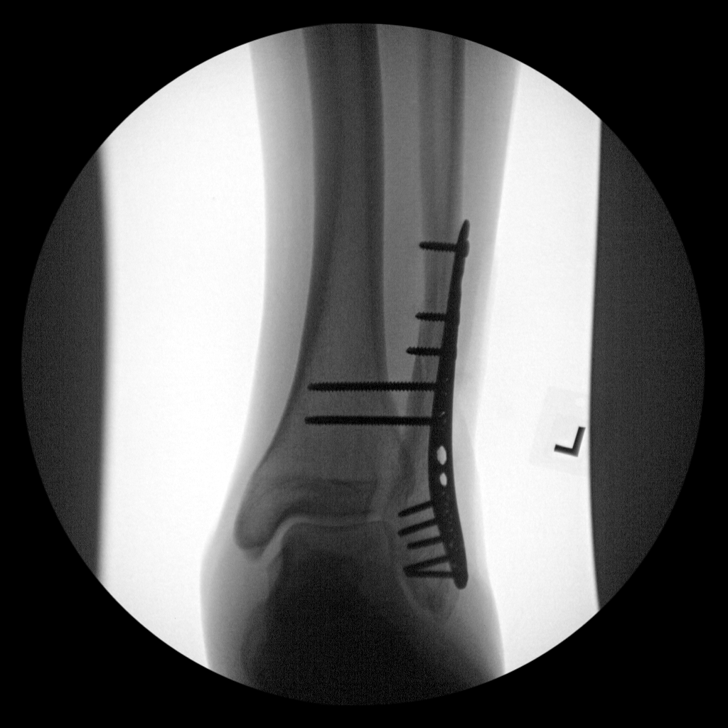
[im 8/9]
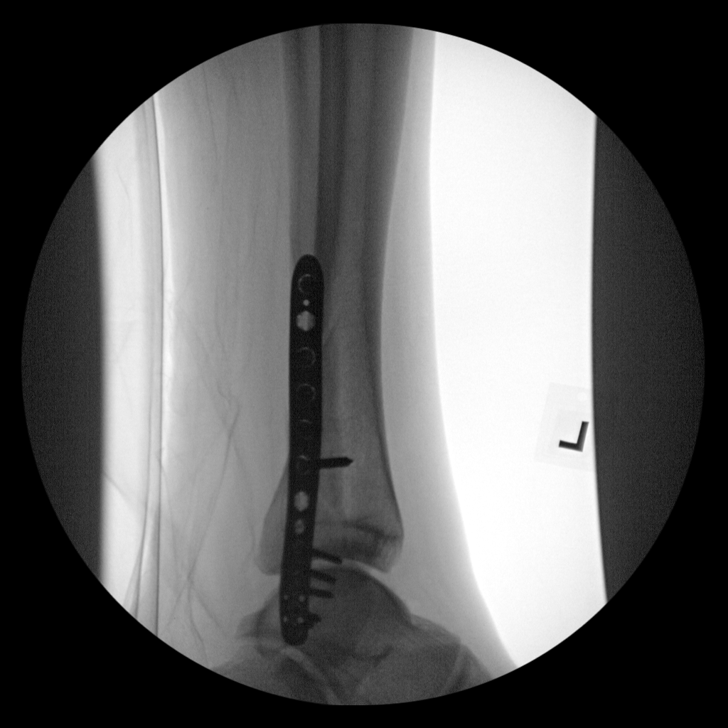
[im 9/9]
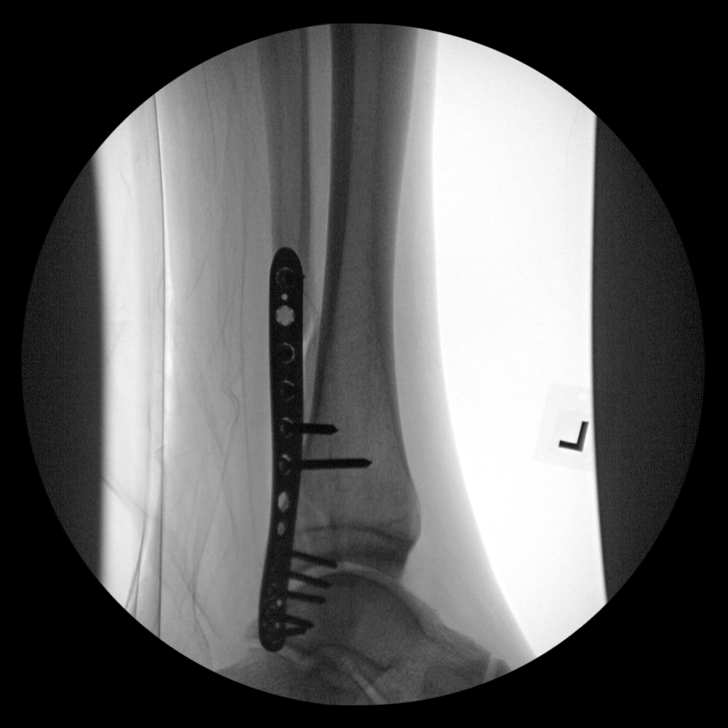

[9 of 9 positions shown; findings below may reference images not displayed]

FINDINGS: Nine intraoperative fluoroscopic images were obtained of the left
ankle. These images demonstrate open reduction and surgical internal
fixation of distal left fibular fracture. Good alignment of fracture
components is noted.
IMPRESSION: Status post open reduction and surgical internal fixation of distal
left fibular fracture.

## 2021-07-08 IMAGING — DX DG ANKLE COMPLETE 3+V*L*
1 series · 3 of 3 positions shown · non-contrast
Comparison: None.

CLINICAL DATA: S/P LEFT OPEN REDUCTION INTERNAL FIXATION (ORIF)
ANKLE FRACTURE

EXAM:
LEFT ANKLE COMPLETE - 3+ VIEW

[Series 1: ankle · 0.14mm/px · 3 of 3 slices shown]
[im 1/3]
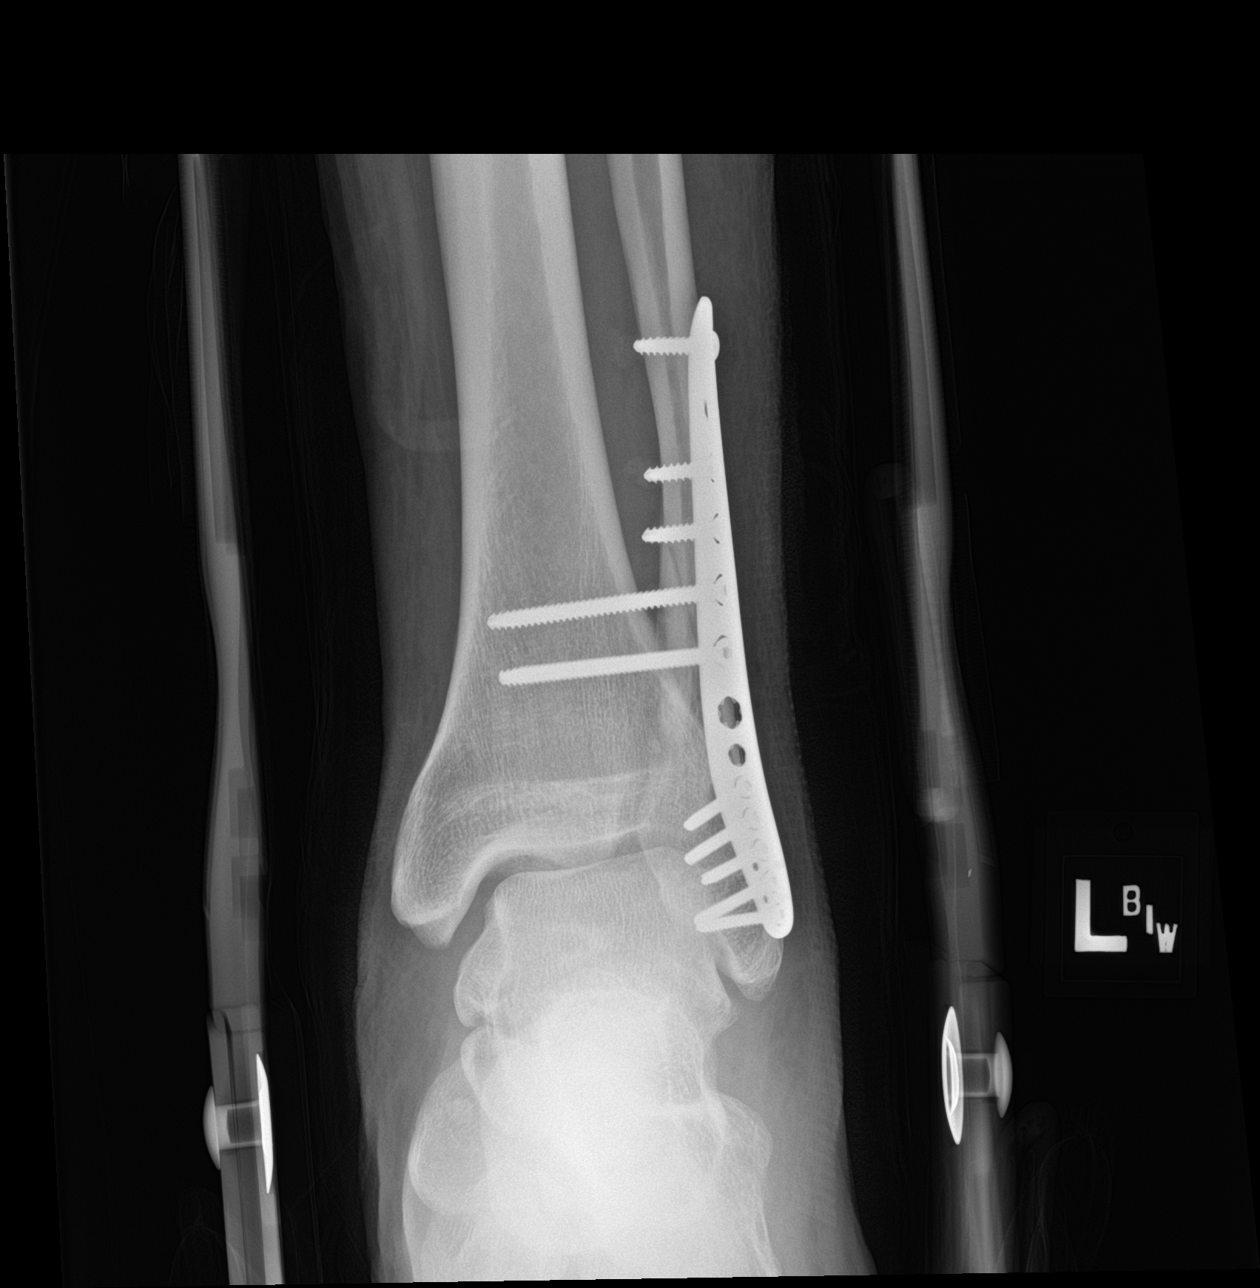
[im 2/3]
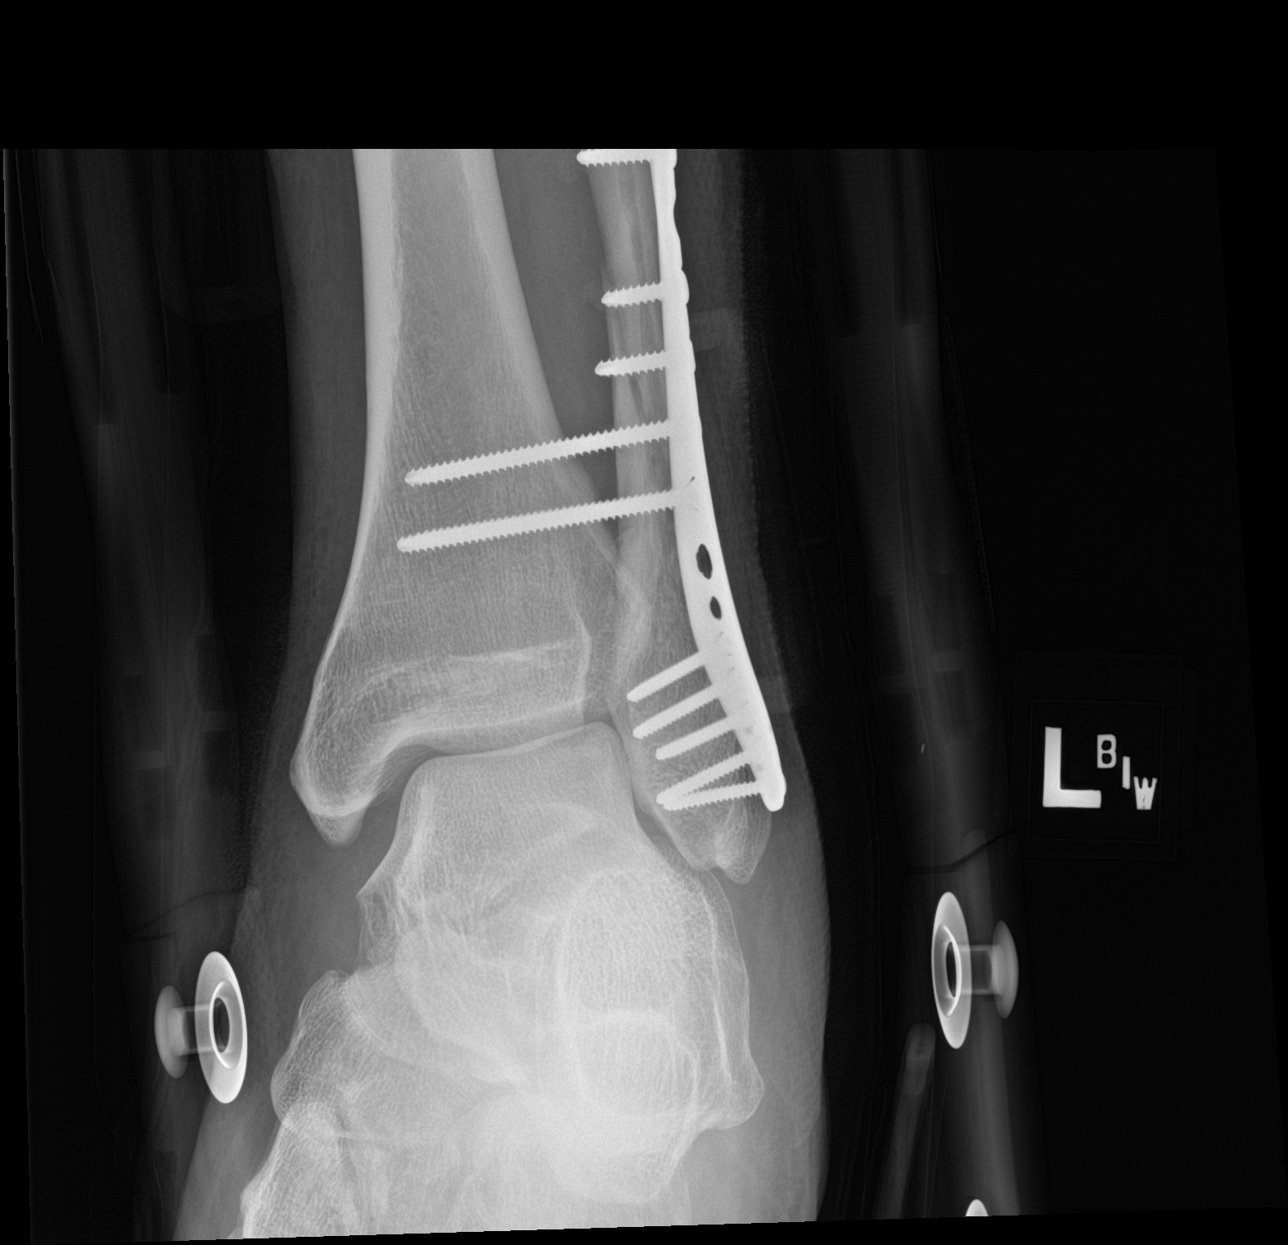
[im 3/3]
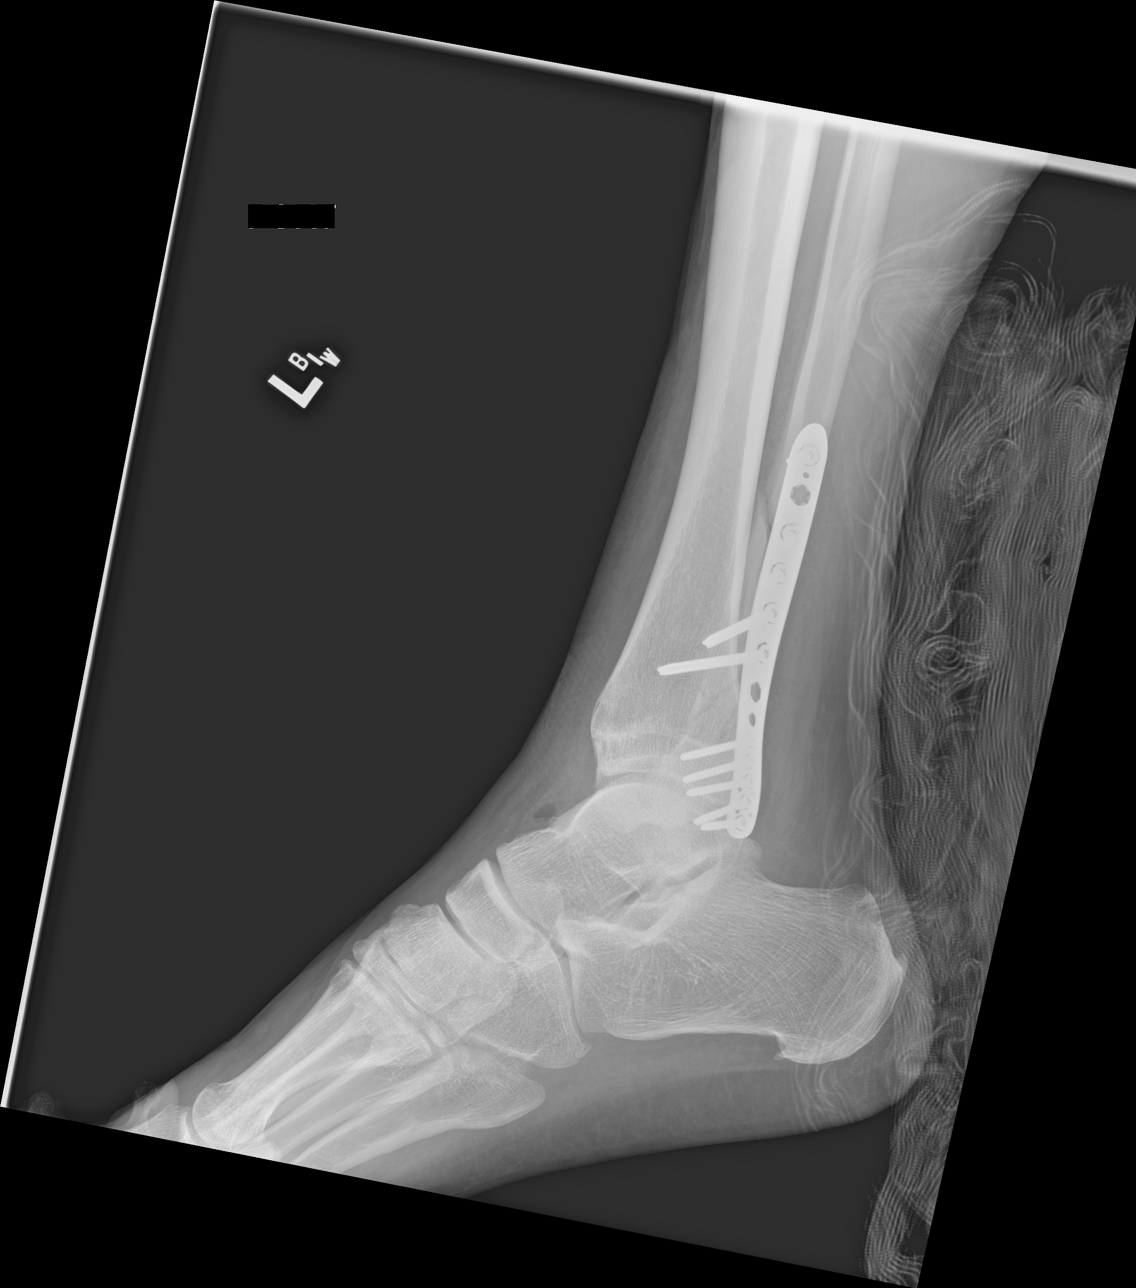

[3 of 3 positions shown; findings below may reference images not displayed]

FINDINGS: Internal dynamic plate fixation of distal fibular fracture. 2
cortical screws span the syndesmosis. Ankle mortise intact.
IMPRESSION: ORIF distal fibular fracture as above.

## 2021-11-26 ENCOUNTER — Ambulatory Visit
Admission: EM | Admit: 2021-11-26 | Discharge: 2021-11-26 | Disposition: A | Discharge status: Home / Self Care | Payer: Self-pay | Attending: Urgent Care | Admitting: Urgent Care

## 2021-11-26 ENCOUNTER — Ambulatory Visit: Payer: Self-pay

## 2021-11-26 ENCOUNTER — Encounter: Payer: Self-pay | Admitting: Emergency Medicine

## 2021-11-26 DIAGNOSIS — L03011 Cellulitis of right finger: Secondary | ICD-10-CM

## 2021-11-26 MED ORDER — NAPROXEN 500 MG PO TABS: 500.0000 mg | ORAL_TABLET | Freq: Two times a day (BID) | ORAL | 0 refills | Status: DC

## 2021-11-26 MED ORDER — DOXYCYCLINE HYCLATE 100 MG PO CAPS: 100.0000 mg | ORAL_CAPSULE | Freq: Two times a day (BID) | ORAL | 0 refills | Status: DC

## 2021-11-26 NOTE — ED Provider Notes (Signed)
[facility]  Note:  This document was prepared using Conservation officer, historic buildings and may include unintentional dictation errors.  MRN: [id number] DOB: Aug 31, 1975  Subjective:   Juan Marshall is a 46 y.o. male presenting for persistent right thumb pain with redness and swelling.  Patient slid his finger down a stair railing that was more than 3 days ago and believes he injured it then.  Cannot appreciate foreign body but is worried that he might have a splinter.  No spontaneous drainage or bleeding.  No current facility-administered medications for this encounter.  Current Outpatient Medications:    Cholecalciferol (VITAMIN D3) 25 MCG (1000 UT) CAPS, Take 1,000 Units by mouth daily. , Disp: , Rfl:    methocarbamol (ROBAXIN) 500 MG tablet, Take 1 tablet (500 mg total) by mouth every 6 (six) hours as needed for muscle spasms., Disp: 25 tablet, Rfl: 1   oxyCODONE-acetaminophen (PERCOCET/ROXICET) 5-325 MG tablet, Take 1 tablet by mouth every 4 (four) hours as needed for severe pain., Disp: , Rfl:    vitamin B-12 (CYANOCOBALAMIN) 100 MCG tablet, Take 100 mcg by mouth daily., Disp: , Rfl:    Allergies  Allergen Reactions   Penicillins     Unsure    Past Medical History:  Diagnosis Date   Ankle fracture    left   GERD (gastroesophageal reflux disease)      Past Surgical History:  Procedure Laterality Date   ORIF ANKLE FRACTURE Left 07/12/2019   Procedure: OPEN REDUCTION INTERNAL FIXATION (ORIF) ANKLE FRACTURE;  Surgeon: Roby Lofts, MD;  Location: [facility] OR;  Service: Orthopedics;  Laterality: Left;    History reviewed. No pertinent family history.  Social History   Tobacco Use   Smoking status: Every Day    Packs/day: 0.50    Types: Cigarettes   Smokeless tobacco: Never  Vaping Use   Vaping Use: Never used  Substance Use Topics   Alcohol use: Yes   Drug use: Not Currently    ROS   Objective:   Vitals: BP 132/83 (BP Location: Right Arm)    Pulse 71   Temp 98.4 F (36.9 C) (Oral)   Resp 16   SpO2 98%   Physical Exam Constitutional:      General: He is not in acute distress.    Appearance: Normal appearance. He is well-developed and normal weight. He is not ill-appearing, toxic-appearing or diaphoretic.  HENT:     Head: Normocephalic and atraumatic.     Right Ear: External ear normal.     Left Ear: External ear normal.     Nose: Nose normal.     Mouth/Throat:     Pharynx: Oropharynx is clear.  Eyes:     General: No scleral icterus.       Right eye: No discharge.        Left eye: No discharge.     Extraocular Movements: Extraocular movements intact.  Cardiovascular:     Rate and Rhythm: Normal rate.  Pulmonary:     Effort: Pulmonary effort is normal.  Musculoskeletal:       Hands:     Cervical back: Normal range of motion.  Neurological:     Mental Status: He is alert and oriented to person, place, and time.  Psychiatric:        Mood and Affect: Mood normal.        Behavior: Behavior normal.        Thought Content: Thought content normal.  Judgment: Judgment normal.     Dressing placed to the right thumb.  Assessment and Plan :   PDMP not reviewed this encounter.  1. Paronychia of right thumb     Recommended starting doxycycline.  Use naproxen for pain and inflammation.  Counseled patient on potential for adverse effects with medications prescribed/recommended today, ER and return-to-clinic precautions discussed, patient verbalized understanding.    Wallis Bamberg, PA-C 11/26/21 1310

## 2021-11-26 NOTE — ED Triage Notes (Signed)
Slid finger down stair railing 3 days prior and believes he got a splinter in it, now having some redness/swelling/drainage from area. Denies fever

## 2022-05-10 ENCOUNTER — Ambulatory Visit
Admission: EM | Admit: 2022-05-10 | Discharge: 2022-05-10 | Disposition: A | Discharge status: Home / Self Care | Payer: Self-pay | Attending: Urgent Care | Admitting: Urgent Care

## 2022-05-10 DIAGNOSIS — K122 Cellulitis and abscess of mouth: Secondary | ICD-10-CM

## 2022-05-10 DIAGNOSIS — S161XXA Strain of muscle, fascia and tendon at neck level, initial encounter: Secondary | ICD-10-CM

## 2022-05-10 DIAGNOSIS — M436 Torticollis: Secondary | ICD-10-CM

## 2022-05-10 MED ORDER — IBUPROFEN 600 MG PO TABS: 600.0000 mg | ORAL_TABLET | Freq: Four times a day (QID) | ORAL | 0 refills | Status: DC | PRN

## 2022-05-10 MED ORDER — KETOROLAC TROMETHAMINE 60 MG/2ML IM SOLN
60.0000 mg | Freq: Once | INTRAMUSCULAR | Status: AC
  Administered 2022-05-10: 60 mg via INTRAMUSCULAR

## 2022-05-10 MED ORDER — TIZANIDINE HCL 4 MG PO TABS: 4.0000 mg | ORAL_TABLET | Freq: Every day | ORAL | 0 refills | Status: DC

## 2022-05-10 MED ORDER — CLINDAMYCIN HCL 300 MG PO CAPS: 300.0000 mg | ORAL_CAPSULE | Freq: Three times a day (TID) | ORAL | 0 refills | Status: DC

## 2022-05-10 NOTE — Discharge Instructions (Addendum)
We will manage this as an uvulitis, throat infection with clindamycin. For sore throat or cough try using a honey-based tea. Use 3 teaspoons of honey with juice squeezed from half lemon. Place shaved pieces of ginger into 1/2-1 cup of water and warm over stove top. Then mix the ingredients and repeat every 4 hours as needed. Please take ibuprofen  every 6 hours with food alternating with OR taken together with Tylenol -  every 6 hours for throat pain, fevers, aches and pains. Hydrate very well with at least 2 liters of water. Eat light meals such as soups (chicken and noodles, vegetable, chicken and wild rice).  Do not eat foods that you are allergic to.  Taking an antihistamine like Zyrtec can help against postnasal drainage, sinus congestion which can cause sinus pain, sinus headaches, throat pain, painful swallowing, coughing.  You can take this together with pseudoephedrine (Sudafed) at a dose of 60 mg 3 times a day or twice daily as needed for the same kind of nasal drip, congestion.  Use cough medication as needed. Use tizanidine as a muscle relaxant for the neck spasms.

## 2022-05-10 NOTE — ED Triage Notes (Signed)
Pt c/o neck pain that began on Sunday after falling asleep in a chair. Pain started on L side, has moved around to R side. Pt reports decreased ROM. Pt also endorses HA.  Pt also c/o pain with swallowing, which pt believes is from an issue with his wisdom tooth.

## 2022-05-10 NOTE — ED Provider Notes (Signed)
[facility]  Note:  This document was prepared using Conservation officer, historic buildings and may include unintentional dictation errors.  MRN: [id number] DOB: 04-14-1975  Subjective:   Juan Marshall is a 47 y.o. male presenting for 3-day history of persistent throat pain, painful swallowing, now having pain on his neck worse to the right side.  Is having difficulty turning his head.  No fall, trauma but he does think that this was related to sleeping wrong on the chair.  No fever, runny or stuffy nose, cough, chest pain, shortness of breath.  Patient is a smoker.  No history of respiratory disorders.  No current facility-administered medications for this encounter.  Current Outpatient Medications:    Cholecalciferol (VITAMIN D3) 25 MCG (1000 UT) CAPS, Take 1,000 Units by mouth daily. , Disp: , Rfl:    doxycycline (VIBRAMYCIN) 100 MG capsule, Take 1 capsule (100 mg total) by mouth 2 (two) times daily., Disp: 20 capsule, Rfl: 0   methocarbamol (ROBAXIN) 500 MG tablet, Take 1 tablet (500 mg total) by mouth every 6 (six) hours as needed for muscle spasms., Disp: 25 tablet, Rfl: 1   naproxen (NAPROSYN) 500 MG tablet, Take 1 tablet (500 mg total) by mouth 2 (two) times daily with a meal., Disp: 30 tablet, Rfl: 0   oxyCODONE-acetaminophen (PERCOCET/ROXICET) 5-325 MG tablet, Take 1 tablet by mouth every 4 (four) hours as needed for severe pain., Disp: , Rfl:    vitamin B-12 (CYANOCOBALAMIN) 100 MCG tablet, Take 100 mcg by mouth daily., Disp: , Rfl:    Allergies  Allergen Reactions   Penicillins     Unsure    Past Medical History:  Diagnosis Date   Ankle fracture    left   GERD (gastroesophageal reflux disease)      Past Surgical History:  Procedure Laterality Date   ORIF ANKLE FRACTURE Left 07/12/2019   Procedure: OPEN REDUCTION INTERNAL FIXATION (ORIF) ANKLE FRACTURE;  Surgeon: Roby Lofts, MD;  Location: [facility];  Service: Orthopedics;  Laterality: Left;     History reviewed. No pertinent family history.  Social History   Tobacco Use   Smoking status: Every Day    Packs/day: .5    Types: Cigarettes   Smokeless tobacco: Never  Vaping Use   Vaping Use: Never used  Substance Use Topics   Alcohol use: Yes   Drug use: Not Currently    ROS   Objective:   Vitals: BP 120/83 (BP Location: Right Arm)   Pulse 75   Temp 98.8 F (37.1 C) (Oral)   Resp 18   SpO2 96%   Physical Exam Constitutional:      General: He is not in acute distress.    Appearance: Normal appearance. He is well-developed and normal weight. He is not ill-appearing, toxic-appearing or diaphoretic.  HENT:     Head: Normocephalic and atraumatic.     Right Ear: Tympanic membrane, ear canal and external ear normal. No drainage, swelling or tenderness. No middle ear effusion. There is no impacted cerumen. Tympanic membrane is not erythematous or bulging.     Left Ear: Tympanic membrane, ear canal and external ear normal. No drainage, swelling or tenderness.  No middle ear effusion. There is no impacted cerumen. Tympanic membrane is not erythematous or bulging.     Nose: Nose normal. No congestion or rhinorrhea.     Mouth/Throat:     Mouth: Mucous membranes are moist.     Pharynx: Pharyngeal swelling, posterior oropharyngeal erythema and  uvula swelling present. No oropharyngeal exudate.  Eyes:     General: No scleral icterus.       Right eye: No discharge.        Left eye: No discharge.     Extraocular Movements: Extraocular movements intact.     Conjunctiva/sclera: Conjunctivae normal.  Cardiovascular:     Rate and Rhythm: Normal rate.  Pulmonary:     Effort: Pulmonary effort is normal.  Musculoskeletal:     Cervical back: Normal range of motion and neck supple. Spasms, torticollis and tenderness present. No swelling, edema, deformity, erythema, signs of trauma, lacerations, rigidity, bony tenderness or crepitus. Pain with movement present. No muscular  tenderness. Normal range of motion.  Neurological:     General: No focal deficit present.     Mental Status: He is alert and oriented to person, place, and time.  Psychiatric:        Mood and Affect: Mood normal.        Behavior: Behavior normal.        Thought Content: Thought content normal.        Judgment: Judgment normal.      Assessment and Plan :   PDMP not reviewed this encounter.  1. Uvulitis   2. Neck strain, initial encounter   3. Torticollis    Will manage for uvulitis with clindamycin given his penicillin allergy.  Also will manage conservatively for a neck strain with NSAID and muscle relaxant, rest and modification of physical activity.  Anticipatory guidance provided.  Counseled patient on potential for adverse effects with medications prescribed/recommended today, ER and return-to-clinic precautions discussed, patient verbalized understanding.    Wallis Bamberg, [place] 05/10/22 1857

## 2022-10-17 ENCOUNTER — Ambulatory Visit
Admission: EM | Admit: 2022-10-17 | Discharge: 2022-10-17 | Disposition: A | Discharge status: Home / Self Care | Payer: 59 | Attending: Internal Medicine | Admitting: Internal Medicine

## 2022-10-17 DIAGNOSIS — S83411A Sprain of medial collateral ligament of right knee, initial encounter: Secondary | ICD-10-CM | POA: Diagnosis not present

## 2022-10-17 NOTE — ED Triage Notes (Signed)
Pt presents to UC w/ c/o right inner knee pain x3 days. Pt states he was "picking up a box at work and the box fell on his right leg and he heard a pop." Pt has used ice and elevation of knee without relief.

## 2022-10-17 NOTE — Discharge Instructions (Addendum)
Take your prescription strength ibuprofen 1 tablet every 6 hours with food as discussed. [email or web address] [facility]  [address], [address], [place], [place] [address]  4.6 mi [phone]

## 2022-10-17 NOTE — ED Provider Notes (Signed)
[facility]    CSN: [id number] Arrival date & time: 10/17/22  1308      History   Chief Complaint No chief complaint on file.   HPI Juan Marshall is a 47 y.o. male.   HPI Right knee injury occurred 2 days ago, patient works for [employer] was on the truck, was moving a box from a shelf to conveyor belt when heavy box below box he was moving fell off shelf striking his right lower leg.  States heard a pop, fell to the ground immediately developed pain and swelling on the medial side of his knee.  Pain relieved with rest, increases with range of motion.  Admits limited flexion and extension.  Denies weakness, paresthesias, other injury.  Denies prior knee injury.  Leftover tizanidine without relief.  Denies other injury, bruising, lacerations. Did not initially report injury to work although states he reported it today.  Past Medical History:  Diagnosis Date   Ankle fracture    left   GERD (gastroesophageal reflux disease)     Patient Active Problem List   Diagnosis Date Noted   Closed fracture of left ankle 07/09/2019    Past Surgical History:  Procedure Laterality Date   ORIF ANKLE FRACTURE Left 07/12/2019   Procedure: OPEN REDUCTION INTERNAL FIXATION (ORIF) ANKLE FRACTURE;  Surgeon: Roby Lofts, MD;  Location: [facility];  Service: Orthopedics;  Laterality: Left;       Home Medications    Prior to Admission medications   Medication Sig Start Date End Date Taking? Authorizing Provider  Cholecalciferol (VITAMIN D3) 25 MCG (1000 UT) CAPS Take 1,000 Units by mouth daily.     [provider]  clindamycin (CLEOCIN) 300 MG capsule Take 1 capsule (300 mg total) by mouth 3 (three) times daily. 05/10/22   Wallis Bamberg, PA-C  doxycycline (VIBRAMYCIN) 100 MG capsule Take 1 capsule (100 mg total) by mouth 2 (two) times daily. 11/26/21   Wallis Bamberg, PA-C  ibuprofen (ADVIL) 600 MG tablet Take 1 tablet (600 mg total) by mouth every 6 (six) hours as needed. 05/10/22   Wallis Bamberg, PA-C  oxyCODONE-acetaminophen (PERCOCET/ROXICET) 5-325 MG tablet Take 1 tablet by mouth every 4 (four) hours as needed for severe pain.    [provider]  tiZANidine (ZANAFLEX) 4 MG tablet Take 1 tablet (4 mg total) by mouth at bedtime. 05/10/22   Wallis Bamberg, PA-C  vitamin B-12 (CYANOCOBALAMIN) 100 MCG tablet Take 100 mcg by mouth daily.    [provider]    Family History History reviewed. No pertinent family history.  Social History Social History   Tobacco Use   Smoking status: Every Day    Current packs/day: 0.50    Types: Cigarettes   Smokeless tobacco: Never  Vaping Use   Vaping status: Never Used  Substance Use Topics   Alcohol use: Yes   Drug use: Not Currently     Allergies   Penicillins   Review of Systems Review of Systems  Musculoskeletal:  Positive for arthralgias, gait problem and joint swelling. Negative for myalgias.  Skin:  Negative for color change and wound.     Physical Exam Triage Vital Signs ED Triage Vitals  Encounter Vitals Group     BP 10/17/22 1334 119/71     Systolic BP Percentile --      Diastolic BP Percentile --      Pulse Rate 10/17/22 1334 71     Resp 10/17/22 1334 16     Temp --  Temp src --      SpO2 10/17/22 1334 97 %     Weight --      Height --      Head Circumference --      Peak Flow --      Pain Score 10/17/22 1328 7     Pain Loc --      Pain Education --      Exclude from Growth Chart --    No data found.  Updated Vital Signs BP 119/71 (BP Location: Right Arm)   Pulse 71   Resp 16   SpO2 97%   Visual Acuity Right Eye Distance:   Left Eye Distance:   Bilateral Distance:    Right Eye Near:   Left Eye Near:    Bilateral Near:     Physical Exam Vitals and nursing note reviewed.  HENT:     Head: Normocephalic and atraumatic.  Musculoskeletal:     Right knee: Swelling and effusion present. No deformity, erythema, ecchymosis, lacerations or bony tenderness. Decreased range  of motion. Tenderness present over the medial joint line (mild). No LCL laxity or MCL laxity. Normal alignment and normal patellar mobility.     Instability Tests: Anterior drawer test negative. Posterior drawer test negative.     Right lower leg: Normal.  Skin:    General: Skin is warm and dry.  Neurological:     Mental Status: He is alert.      UC Treatments / Results  Labs (all labs ordered are listed, but only abnormal results are displayed) Labs Reviewed - No data to display  EKG   Radiology No results found.  Procedures Procedures (including critical care time)  Medications Ordered in UC Medications - No data to display  Initial Impression / Assessment and Plan / UC Course  I have reviewed the triage vital signs and the nursing notes.  Pertinent labs & imaging results that were available during my care of the patient were reviewed by me and considered in my medical decision making (see chart for details).     47 year old male with right knee injury while working 3 days ago struck on right lower leg by a heavy box.  Since then having pain or swelling medial aspect has small effusion local swelling but no ligamentous laxity or deformity he is able to bear weight. Stated by staff he will take his prescription ibuprofen 600 mg every 6 hours with food as needed for pain, recommend RICE, rest, follow-up with PCP or Ortho  Final Clinical Impressions(s) / UC Diagnoses   Final diagnoses:  Sprain of medial collateral ligament of right knee, initial encounter     Discharge Instructions      Take your prescription strength ibuprofen 1 tablet every 6 hours with food as discussed. [email or web address] [facility]  [address], [address], [place], [place] [address]  4.6 mi [phone]   ED Prescriptions   None    PDMP not reviewed this encounter.   Meliton Rattan, [place] 10/17/22 1450

## 2023-06-21 ENCOUNTER — Ambulatory Visit
Admission: EM | Admit: 2023-06-21 | Discharge: 2023-06-21 | Disposition: A | Discharge status: Home / Self Care | Payer: Self-pay | Attending: Family Medicine | Admitting: Family Medicine

## 2023-06-21 DIAGNOSIS — M25561 Pain in right knee: Secondary | ICD-10-CM

## 2023-06-21 NOTE — ED Provider Notes (Signed)
 [facility]    CSN: [id number] Arrival date & time: 06/21/23  1726      History   Chief Complaint Chief Complaint  Patient presents with   Knee Pain    HPI Juan Marshall is a 48 y.o. male presents for eval of knee pain.  Patient was seen in urgent care on 10/17/2022 for a work-related injury to his right knee.  He was diagnosed with a knee sprain and advised RICE therapy.  He did not file a Teacher, adult education. claim.  He reports since then he has been having intermittent issues with his right medial knee.  Today it seemed to have "flared".  No new injury or known inciting event.  States it felt a little swollen but this resolved after he iced and elevated it.  No numbness tingling or history of knee surgeries.  He does not have a PCP.  No other concerns at this time.   Knee Pain   Past Medical History:  Diagnosis Date   Ankle fracture    left   GERD (gastroesophageal reflux disease)     Patient Active Problem List   Diagnosis Date Noted   Closed fracture of left ankle 07/09/2019    Past Surgical History:  Procedure Laterality Date   ORIF ANKLE FRACTURE Left 07/12/2019   Procedure: OPEN REDUCTION INTERNAL FIXATION (ORIF) ANKLE FRACTURE;  Surgeon: Laneta Pintos, MD;  Location: [facility];  Service: Orthopedics;  Laterality: Left;       Home Medications    Prior to Admission medications   Medication Sig Start Date End Date Taking? Authorizing Provider  Cholecalciferol (VITAMIN D3) 25 MCG (1000 UT) CAPS Take 1,000 Units by mouth daily.     [provider]  clindamycin  (CLEOCIN ) 300 MG capsule Take 1 capsule (300 mg total) by mouth 3 (three) times daily. 05/10/22   Adolph Hoop, PA-C  doxycycline  (VIBRAMYCIN ) 100 MG capsule Take 1 capsule (100 mg total) by mouth 2 (two) times daily. 11/26/21   Adolph Hoop, PA-C  ibuprofen  (ADVIL ) 600 MG tablet Take 1 tablet (600 mg total) by mouth every 6 (six) hours as needed. 05/10/22   Adolph Hoop, PA-C  oxyCODONE -acetaminophen   (PERCOCET/ROXICET) 5-325 MG tablet Take 1 tablet by mouth every 4 (four) hours as needed for severe pain.    [provider]  tiZANidine  (ZANAFLEX ) 4 MG tablet Take 1 tablet (4 mg total) by mouth at bedtime. 05/10/22   Adolph Hoop, PA-C  vitamin B-12 (CYANOCOBALAMIN) 100 MCG tablet Take 100 mcg by mouth daily.    [provider]    Family History History reviewed. No pertinent family history.  Social History Social History   Tobacco Use   Smoking status: Every Day    Current packs/day: 0.50    Types: Cigarettes   Smokeless tobacco: Never  Vaping Use   Vaping status: Never Used  Substance Use Topics   Alcohol use: Yes   Drug use: Not Currently     Allergies   Penicillins   Review of Systems Review of Systems  Musculoskeletal:        Right knee pain     Physical Exam Triage Vital Signs ED Triage Vitals  Encounter Vitals Group     BP 06/21/23 1805 117/77     Systolic BP Percentile --      Diastolic BP Percentile --      Pulse Rate 06/21/23 1805 72     Resp 06/21/23 1805 17     Temp 06/21/23 1805  98.8 F (37.1 C)     Temp Source 06/21/23 1805 Oral     SpO2 06/21/23 1805 98 %     Weight --      Height --      Head Circumference --      Peak Flow --      Pain Score 06/21/23 1804 7     Pain Loc --      Pain Education --      Exclude from Growth Chart --    No data found.  Updated Vital Signs BP 117/77 (BP Location: Right Arm)   Pulse 72   Temp 98.8 F (37.1 C) (Oral)   Resp 17   SpO2 98%   Visual Acuity Right Eye Distance:   Left Eye Distance:   Bilateral Distance:    Right Eye Near:   Left Eye Near:    Bilateral Near:     Physical Exam Vitals and nursing note reviewed.  Constitutional:      General: He is not in acute distress.    Appearance: Normal appearance. He is not ill-appearing.  HENT:     Head: Normocephalic and atraumatic.  Eyes:     Pupils: Pupils are equal, round, and reactive to light.  Cardiovascular:      Rate and Rhythm: Normal rate.  Pulmonary:     Effort: Pulmonary effort is normal.  Musculoskeletal:     Right knee: No swelling, deformity, effusion, erythema, ecchymosis, lacerations, bony tenderness or crepitus. Normal range of motion. Tenderness present over the medial joint line. No lateral joint line or patellar tendon tenderness. Normal alignment and normal patellar mobility.     Comments: Negative valgus and varus stress test  Skin:    General: Skin is warm and dry.  Neurological:     General: No focal deficit present.     Mental Status: He is alert and oriented to person, place, and time.  Psychiatric:        Mood and Affect: Mood normal.        Behavior: Behavior normal.      UC Treatments / Results  Labs (all labs ordered are listed, but only abnormal results are displayed) Labs Reviewed - No data to display  EKG   Radiology No results found.  Procedures Procedures (including critical care time)  Medications Ordered in UC Medications - No data to display  Initial Impression / Assessment and Plan / UC Course  I have reviewed the triage vital signs and the nursing notes.  Pertinent labs & imaging results that were available during my care of the patient were reviewed by me and considered in my medical decision making (see chart for details).     Reviewed exam and symptoms with patient.  No red flags.  Discussed his knee pain likely residual from his previous injury.  No new injury.  Will defer x-ray at this time.  Discussed RICE therapy.  Nursing staff was able to establish patient with a PCP where he will follow-up.  Did advise he also follow-up with orthopedics for further evaluation of his symptoms.  Discussed any referral to need to go through his PCP.  ER precautions reviewed and patient verbalized understanding. Final Clinical Impressions(s) / UC Diagnoses   Final diagnoses:  Acute pain of right knee     Discharge Instructions      You may elevate  and ice your knee as needed.  The knee brace will help support the joint.  May take over-the-counter Tylenol   or ibuprofen  as needed.  Please follow-up with your PCP in 2 to 3 days for recheck.  Please follow-up with orthopedics if your symptoms are not improving, contact information has been provided.  Please note any referrals will need to go through PCP.  Please go to the ER if you develop any worsening symptoms.  Hope you feel better soon!   ED Prescriptions   None    PDMP not reviewed this encounter.   Alleen Arbour, NP 06/21/23 [phone]

## 2023-06-21 NOTE — Discharge Instructions (Signed)
 You may elevate and ice your knee as needed.  The knee brace will help support the joint.  May take over-the-counter Tylenol  or ibuprofen  as needed.  Please follow-up with your PCP in 2 to 3 days for recheck.  Please follow-up with orthopedics if your symptoms are not improving, contact information has been provided.  Please note any referrals will need to go through PCP.  Please go to the ER if you develop any worsening symptoms.  Hope you feel better soon!

## 2023-06-21 NOTE — ED Triage Notes (Signed)
 Pt present with rt knee pain x 1 year. Pt states the pain is on and off, sometimes is worse during weather changes. Pt has elevated and iced at home.

## 2023-08-03 ENCOUNTER — Ambulatory Visit: Payer: Self-pay | Admitting: Family Medicine

## 2023-09-15 ENCOUNTER — Other Ambulatory Visit: Payer: Self-pay

## 2023-09-15 ENCOUNTER — Encounter (HOSPITAL_COMMUNITY): Payer: Self-pay

## 2023-09-15 ENCOUNTER — Emergency Department (HOSPITAL_COMMUNITY): Payer: Self-pay

## 2023-09-15 ENCOUNTER — Emergency Department (HOSPITAL_COMMUNITY)
Admission: EM | Admit: 2023-09-15 | Discharge: 2023-09-15 | Disposition: A | Discharge status: Home / Self Care | Payer: Self-pay | Attending: Emergency Medicine | Admitting: Emergency Medicine

## 2023-09-15 DIAGNOSIS — W208XXA Other cause of strike by thrown, projected or falling object, initial encounter: Secondary | ICD-10-CM | POA: Insufficient documentation

## 2023-09-15 DIAGNOSIS — J9859 Other diseases of mediastinum, not elsewhere classified: Secondary | ICD-10-CM | POA: Diagnosis not present

## 2023-09-15 DIAGNOSIS — S01322A Laceration with foreign body of left ear, initial encounter: Secondary | ICD-10-CM | POA: Insufficient documentation

## 2023-09-15 DIAGNOSIS — F17200 Nicotine dependence, unspecified, uncomplicated: Secondary | ICD-10-CM | POA: Diagnosis not present

## 2023-09-15 DIAGNOSIS — Z23 Encounter for immunization: Secondary | ICD-10-CM | POA: Insufficient documentation

## 2023-09-15 DIAGNOSIS — S20211A Contusion of right front wall of thorax, initial encounter: Secondary | ICD-10-CM

## 2023-09-15 DIAGNOSIS — S20221A Contusion of right back wall of thorax, initial encounter: Secondary | ICD-10-CM | POA: Diagnosis not present

## 2023-09-15 DIAGNOSIS — R0789 Other chest pain: Secondary | ICD-10-CM | POA: Diagnosis present

## 2023-09-15 DIAGNOSIS — S01312A Laceration without foreign body of left ear, initial encounter: Secondary | ICD-10-CM

## 2023-09-15 LAB — I-STAT CHEM 8, ED
BUN: 17 mg/dL (ref 6–20)
Calcium, Ion: 1.06 mmol/L — ABNORMAL LOW (ref 1.15–1.40)
Chloride: 105 mmol/L (ref 98–111)
Creatinine, Ser: 0.9 mg/dL (ref 0.61–1.24)
Glucose, Bld: 100 mg/dL — ABNORMAL HIGH (ref 70–99)
HCT: 44 % (ref 39.0–52.0)
Hemoglobin: 15 g/dL (ref 13.0–17.0)
Potassium: 4.3 mmol/L (ref 3.5–5.1)
Sodium: 140 mmol/L (ref 135–145)
TCO2: 25 mmol/L (ref 22–32)

## 2023-09-15 MED ORDER — DICLOFENAC SODIUM 75 MG PO TBEC: 75.0000 mg | DELAYED_RELEASE_TABLET | Freq: Two times a day (BID) | ORAL | 0 refills | Status: AC

## 2023-09-15 MED ORDER — TETANUS-DIPHTH-ACELL PERTUSSIS 5-2.5-18.5 LF-MCG/0.5 IM SUSY
0.5000 mL | PREFILLED_SYRINGE | Freq: Once | INTRAMUSCULAR | Status: AC
  Administered 2023-09-15: 0.5 mL via INTRAMUSCULAR
  Filled 2023-09-15: qty 0.5

## 2023-09-15 MED ORDER — IOHEXOL 350 MG/ML SOLN
75.0000 mL | Freq: Once | INTRAVENOUS | Status: AC | PRN
  Administered 2023-09-15: 75 mL via INTRAVENOUS

## 2023-09-15 NOTE — ED Notes (Signed)
 Paper work given and reviewed with pt. Pt leaving in no new onset distress at this time. Pt is to leaving to lobby to call for ride.

## 2023-09-15 NOTE — ED Provider Notes (Signed)
 [place] EMERGENCY DEPARTMENT AT [facility] Provider Note   CSN: [id number] Arrival date & time: 09/15/23  9088     Patient presents with: Chest Injury   Juan Marshall is a 48 y.o. male.   48 year old male with past medical history of GERD presents with complaint of right upper chest pain.  Patient states 1 week ago he was pulling on a bookcase that was full of books when the bookcase fell and landed on top of him.  He states that his roommates were able to pull him out from underneath the bookcase.  Since that time he has had pain in his right upper chest which is worse with taking a deep breath or coughing.  Also difficulty with lifting heavy boxes at work.  Also small wound to left ear.  Last tetanus unknown.       Prior to Admission medications   Medication Sig Start Date End Date Taking? Authorizing Provider  diclofenac  (VOLTAREN ) 75 MG EC tablet Take 1 tablet (75 mg total) by mouth 2 (two) times daily for 10 days. 09/15/23 09/25/23 Yes Beverley Leita LABOR, PA-C  Cholecalciferol (VITAMIN D3) 25 MCG (1000 UT) CAPS Take 1,000 Units by mouth daily.     [provider]  vitamin B-12 (CYANOCOBALAMIN) 100 MCG tablet Take 100 mcg by mouth daily.    [provider]    Allergies: Penicillins    Review of Systems Negative except as per HPI Updated Vital Signs BP 108/82 (BP Location: Right Arm)   Pulse 62   Temp 98 F (36.7 C)   Resp 16   Ht 6' 1.5 (1.867 m)   Wt 79.4 kg   SpO2 100%   BMI 22.78 kg/m   Physical Exam Vitals and nursing note reviewed.  Constitutional:      General: He is not in acute distress.    Appearance: He is well-developed. He is not diaphoretic.  HENT:     Head: Normocephalic and atraumatic.   Eyes:     Extraocular Movements: Extraocular movements intact.     Pupils: Pupils are equal, round, and reactive to light.  Cardiovascular:     Rate and Rhythm: Normal rate and regular rhythm.     Heart sounds: Normal heart sounds.   Pulmonary:     Effort: Pulmonary effort is normal.     Breath sounds: Normal breath sounds.  Chest:     Chest wall: Tenderness present.    Abdominal:     Palpations: Abdomen is soft.     Tenderness: There is no abdominal tenderness.  Musculoskeletal:     Cervical back: No tenderness or bony tenderness.     Thoracic back: No tenderness or bony tenderness.     Lumbar back: No tenderness or bony tenderness.  Skin:    General: Skin is warm and dry.     Findings: No erythema or rash.  Neurological:     Mental Status: He is alert and oriented to person, place, and time.  Psychiatric:        Behavior: Behavior normal.     (all labs ordered are listed, but only abnormal results are displayed) Labs Reviewed  I-STAT CHEM 8, ED - Abnormal; Notable for the following components:      Result Value   Glucose, Bld 100 (*)    Calcium, Ion 1.06 (*)    All other components within normal limits    EKG: None  Radiology: CT Chest W Contrast Result Date: 09/15/2023 CLINICAL DATA:  Blunt chest trauma. Pain with cough and deep breath. EXAM: CT CHEST WITH CONTRAST TECHNIQUE: Multidetector CT imaging of the chest was performed during intravenous contrast administration. RADIATION DOSE REDUCTION: This exam was performed according to the departmental dose-optimization program which includes automated exposure control, adjustment of the mA and/or kV according to patient size and/or use of iterative reconstruction technique. CONTRAST:  75mL OMNIPAQUE  IOHEXOL  350 MG/ML SOLN COMPARISON:  Plain films earlier today FINDINGS: Cardiovascular: Aortic atherosclerosis. No aortic laceration or mediastinal hematoma. Normal heart size, without pericardial effusion. Lad coronary artery calcification. Mediastinum/Nodes: No mediastinal or hilar adenopathy. Within the high left mediastinum, a cystic, possibly minimally septated lesion measures 3.0 x 3.9 by 4.5 cm including a 39/264/5. Lungs/Pleura: No pleural fluid. No  pneumothorax. Minimal tracheal deviation left secondary to the above mediastinal process. Mild centrilobular emphysema. Dependent right-greater-than-left lower lobe subsegmental atelectasis. Upper Abdomen: Tiny focus of hyperenhancement in the high right liver is likely a perfusion anomaly. Normal imaged portions of the spleen, stomach, pancreas, adrenal glands, kidneys. No free intraperitoneal air. Musculoskeletal: Mild bilateral gynecomastia. No soft tissue hematoma. No acute osseous abnormality. IMPRESSION: 1. No acute or posttraumatic deformity identified. 2. 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 Consider further evaluation with nonemergent outpatient pre and postcontrast chest MRI. 3. Age advanced coronary artery atherosclerosis. Recommend assessment of coronary risk factors. 4. Aortic atherosclerosis (ICD10-I70.0) and emphysema (ICD10-J43.9). Electronically Signed   By: Rockey Kilts M.D.   On: 09/15/2023 11:10   DG Ribs Unilateral W/Chest Right Result Date: 09/15/2023 CLINICAL DATA:  48 year old male status post bookcase fell on patient yesterday. Continued coughing, pleuritic pain, chest pain. EXAM: RIGHT RIBS AND CHEST - 3+ VIEW COMPARISON:  None Available. FINDINGS: PA view of the chest 0941 hours. Normal lung volumes and mediastinal contours. Visualized tracheal air column is within normal limits. Suggestion of small layering right pleural effusion at the costophrenic angle. Otherwise both lungs appear clear. No pneumothorax. Negative visible bowel gas. Bone mineralization is within normal limits. Three oblique views of the right ribs. No right rib fracture or rib lesion identified. And other visible osseous structures appear intact. IMPRESSION: Evidence of a trace layering right pleural effusion. But no pneumothorax. And no right rib fracture identified. Electronically Signed   By: VEAR Hurst M.D.   On: 09/15/2023 10:25     Procedures   Medications Ordered in the ED   Tdap (BOOSTRIX ) injection 0.5 mL (0.5 mLs Intramuscular Given 09/15/23 0957)  iohexol  (OMNIPAQUE ) 350 MG/ML injection 75 mL (75 mLs Intravenous Contrast Given 09/15/23 1053)                                    Medical Decision Making Amount and/or Complexity of Data Reviewed Radiology: ordered.  Risk Prescription drug management.   This patient presents to the ED for concern of right chest wall pain, this involves an extensive number of treatment options, and is a complaint that carries with it a high risk of complications and morbidity.  The differential diagnosis includes but not limited to rib fracture, contusion, PNX, pulmonary contusion    Co morbidities / Chronic conditions that complicate the patient evaluation  GERD, smoker   Additional history obtained:  Additional history obtained from EMR External records from outside source obtained and reviewed including prior labs on file   Lab Tests:  I Ordered, and personally interpreted labs.  The pertinent results  include: I-STAT Chem-8 with normal renal function   Imaging Studies ordered:  I ordered imaging studies including x-ray right rib series, CT chest with contrast I independently visualized and interpreted imaging which showed no rib fracture, mass to the stone I agree with the radiologist interpretation    Problem List / ED Course / Critical interventions / Medication management  48 year old male presents with complaint of pain in the right side of his chest, worse with coughing and take a deep breath.  Does have pain with palpation of the right upper chest without crepitus or overlying ecchymosis.  CT with incidental finding of mediastinal mass/cystic structure.  Recommends outpatient MRI with and without contrast.  These results were discussed with patient, he was given a printed copy of his results to take to PCP follow-up.  He does not have a primary care provider.  I have contacted transitions of care for  assistance with scheduling PCP as well as information for [facility] clinics for patient to contact for follow-up.  His tetanus is updated.  The left ear wound does not require further treatment at this time. I have reviewed the patients home medicines and have made adjustments as needed   Social Determinants of Health:  No PCP, provided with information for  and wellness as well as [facility] and [facility] referral   Test / Admission - Considered:  Stable for discharge      Final diagnoses:  Contusion of right chest wall, initial encounter  Mediastinal mass  Laceration of left ear, initial encounter    ED Discharge Orders          Ordered    diclofenac  (VOLTAREN ) 75 MG EC tablet  2 times daily        09/15/23 1148               Beverley Leita DELENA DEVONNA 09/15/23 1321    Bernard Drivers, MD 09/16/23 [phone]

## 2023-09-15 NOTE — Discharge Instructions (Signed)
 You will need to follow-up with a primary care provider for MRI of your chest with and without contrast as discussed today. Please contact [place] and wellness or the [facility] to arrange this follow-up.  You can take diclofenac  as needed as prescribed for pain in your chest.  Return to the emergency room for worsening or concerning symptoms.

## 2023-09-15 NOTE — ED Triage Notes (Signed)
 Pt states that a book case fell on his right chest on Thursday.  Pt has pain with coughing an deep breathing.
